# Patient Record
Sex: Female | Born: 1955 | Race: Black or African American | Hispanic: No
Health system: Southern US, Community
[De-identification: ages and names within clinical notes are randomized; demographics above are authoritative.]

---

## 1999-09-11 ENCOUNTER — Ambulatory Visit (HOSPITAL_COMMUNITY): Admission: RE | Admit: 1999-09-11 | Discharge: 1999-09-11 | Payer: Self-pay | Admitting: *Deleted

## 2001-03-07 ENCOUNTER — Inpatient Hospital Stay (HOSPITAL_COMMUNITY): Admission: EM | Admit: 2001-03-07 | Discharge: 2001-03-14 | Payer: Self-pay | Admitting: Emergency Medicine

## 2001-03-08 ENCOUNTER — Encounter: Payer: Self-pay | Admitting: Nephrology

## 2001-03-09 ENCOUNTER — Encounter: Payer: Self-pay | Admitting: Nephrology

## 2001-03-11 ENCOUNTER — Encounter: Payer: Self-pay | Admitting: Nephrology

## 2001-03-13 ENCOUNTER — Encounter: Payer: Self-pay | Admitting: Nephrology

## 2001-06-09 ENCOUNTER — Ambulatory Visit (HOSPITAL_COMMUNITY): Admission: RE | Admit: 2001-06-09 | Discharge: 2001-06-10 | Payer: Self-pay | Admitting: Ophthalmology

## 2002-10-23 ENCOUNTER — Encounter: Payer: Self-pay | Admitting: Vascular Surgery

## 2002-10-23 ENCOUNTER — Ambulatory Visit (HOSPITAL_COMMUNITY): Admission: RE | Admit: 2002-10-23 | Discharge: 2002-10-23 | Payer: Self-pay | Admitting: Vascular Surgery

## 2003-02-09 ENCOUNTER — Ambulatory Visit (HOSPITAL_COMMUNITY): Admission: RE | Admit: 2003-02-09 | Discharge: 2003-02-09 | Payer: Self-pay | Admitting: General Surgery

## 2003-04-05 ENCOUNTER — Ambulatory Visit (HOSPITAL_COMMUNITY): Admission: RE | Admit: 2003-04-05 | Discharge: 2003-04-05 | Payer: Self-pay | Admitting: Vascular Surgery

## 2003-05-13 ENCOUNTER — Encounter (INDEPENDENT_AMBULATORY_CARE_PROVIDER_SITE_OTHER): Payer: Self-pay | Admitting: Specialist

## 2003-05-13 ENCOUNTER — Ambulatory Visit (HOSPITAL_COMMUNITY): Admission: RE | Admit: 2003-05-13 | Discharge: 2003-05-15 | Payer: Self-pay | Admitting: General Surgery

## 2003-05-21 ENCOUNTER — Inpatient Hospital Stay (HOSPITAL_COMMUNITY): Admission: EM | Admit: 2003-05-21 | Discharge: 2003-05-24 | Payer: Self-pay | Admitting: Emergency Medicine

## 2005-08-10 ENCOUNTER — Emergency Department (HOSPITAL_COMMUNITY): Admission: EM | Admit: 2005-08-10 | Discharge: 2005-08-10 | Payer: Self-pay | Admitting: *Deleted

## 2005-11-26 ENCOUNTER — Inpatient Hospital Stay (HOSPITAL_COMMUNITY): Admission: AD | Admit: 2005-11-26 | Discharge: 2005-11-27 | Payer: Self-pay | Admitting: Nephrology

## 2005-12-18 ENCOUNTER — Ambulatory Visit (HOSPITAL_COMMUNITY): Admission: RE | Admit: 2005-12-18 | Discharge: 2005-12-18 | Payer: Self-pay | Admitting: General Surgery

## 2006-05-05 ENCOUNTER — Inpatient Hospital Stay (HOSPITAL_COMMUNITY): Admission: AD | Admit: 2006-05-05 | Discharge: 2006-05-07 | Payer: Self-pay | Admitting: Nephrology

## 2007-02-13 ENCOUNTER — Ambulatory Visit (HOSPITAL_COMMUNITY): Admission: RE | Admit: 2007-02-13 | Discharge: 2007-02-13 | Payer: Self-pay | Admitting: General Surgery

## 2007-04-03 ENCOUNTER — Encounter: Admission: RE | Admit: 2007-04-03 | Discharge: 2007-04-03 | Payer: Self-pay | Admitting: General Surgery

## 2007-04-03 ENCOUNTER — Encounter (INDEPENDENT_AMBULATORY_CARE_PROVIDER_SITE_OTHER): Payer: Self-pay | Admitting: Diagnostic Radiology

## 2007-04-03 ENCOUNTER — Other Ambulatory Visit: Admission: RE | Admit: 2007-04-03 | Discharge: 2007-04-03 | Payer: Self-pay | Admitting: Diagnostic Radiology

## 2007-09-04 ENCOUNTER — Inpatient Hospital Stay (HOSPITAL_COMMUNITY): Admission: RE | Admit: 2007-09-04 | Discharge: 2007-09-07 | Payer: Self-pay | Admitting: Surgery

## 2007-09-04 ENCOUNTER — Encounter (INDEPENDENT_AMBULATORY_CARE_PROVIDER_SITE_OTHER): Payer: Self-pay | Admitting: Surgery

## 2007-10-28 ENCOUNTER — Ambulatory Visit: Payer: Self-pay | Admitting: Vascular Surgery

## 2007-10-30 ENCOUNTER — Ambulatory Visit (HOSPITAL_COMMUNITY): Admission: RE | Admit: 2007-10-30 | Discharge: 2007-10-30 | Payer: Self-pay | Admitting: Vascular Surgery

## 2007-10-30 ENCOUNTER — Ambulatory Visit: Payer: Self-pay | Admitting: Vascular Surgery

## 2008-02-16 ENCOUNTER — Ambulatory Visit: Payer: Self-pay | Admitting: Internal Medicine

## 2008-02-16 ENCOUNTER — Inpatient Hospital Stay (HOSPITAL_COMMUNITY): Admission: AD | Admit: 2008-02-16 | Discharge: 2008-02-20 | Payer: Self-pay | Admitting: Cardiology

## 2008-02-18 ENCOUNTER — Encounter: Payer: Self-pay | Admitting: Cardiology

## 2008-05-13 ENCOUNTER — Ambulatory Visit (HOSPITAL_COMMUNITY): Admission: RE | Admit: 2008-05-13 | Discharge: 2008-05-13 | Payer: Self-pay | Admitting: Orthopedic Surgery

## 2008-05-20 ENCOUNTER — Ambulatory Visit: Payer: Self-pay | Admitting: Internal Medicine

## 2008-07-06 DIAGNOSIS — E119 Type 2 diabetes mellitus without complications: Secondary | ICD-10-CM

## 2008-07-06 DIAGNOSIS — N2581 Secondary hyperparathyroidism of renal origin: Secondary | ICD-10-CM | POA: Insufficient documentation

## 2008-07-06 DIAGNOSIS — E785 Hyperlipidemia, unspecified: Secondary | ICD-10-CM

## 2008-07-06 DIAGNOSIS — E663 Overweight: Secondary | ICD-10-CM | POA: Insufficient documentation

## 2008-07-06 DIAGNOSIS — I10 Essential (primary) hypertension: Secondary | ICD-10-CM | POA: Insufficient documentation

## 2008-07-06 DIAGNOSIS — M1990 Unspecified osteoarthritis, unspecified site: Secondary | ICD-10-CM | POA: Insufficient documentation

## 2008-07-06 DIAGNOSIS — I2589 Other forms of chronic ischemic heart disease: Secondary | ICD-10-CM | POA: Insufficient documentation

## 2008-07-06 DIAGNOSIS — E039 Hypothyroidism, unspecified: Secondary | ICD-10-CM | POA: Insufficient documentation

## 2008-07-06 DIAGNOSIS — D649 Anemia, unspecified: Secondary | ICD-10-CM

## 2008-07-06 DIAGNOSIS — N186 End stage renal disease: Secondary | ICD-10-CM

## 2008-07-06 DIAGNOSIS — I2510 Atherosclerotic heart disease of native coronary artery without angina pectoris: Secondary | ICD-10-CM | POA: Insufficient documentation

## 2008-07-07 ENCOUNTER — Ambulatory Visit: Payer: Self-pay | Admitting: Cardiology

## 2008-07-07 DIAGNOSIS — R0989 Other specified symptoms and signs involving the circulatory and respiratory systems: Secondary | ICD-10-CM | POA: Insufficient documentation

## 2008-07-07 DIAGNOSIS — R079 Chest pain, unspecified: Secondary | ICD-10-CM

## 2008-07-19 ENCOUNTER — Telehealth (INDEPENDENT_AMBULATORY_CARE_PROVIDER_SITE_OTHER): Payer: Self-pay | Admitting: *Deleted

## 2008-07-20 ENCOUNTER — Ambulatory Visit: Payer: Self-pay

## 2008-07-20 ENCOUNTER — Encounter: Payer: Self-pay | Admitting: Internal Medicine

## 2008-09-16 ENCOUNTER — Encounter: Admission: RE | Admit: 2008-09-16 | Discharge: 2008-09-16 | Payer: Self-pay | Admitting: Orthopedic Surgery

## 2008-09-21 ENCOUNTER — Ambulatory Visit (HOSPITAL_BASED_OUTPATIENT_CLINIC_OR_DEPARTMENT_OTHER): Admission: RE | Admit: 2008-09-21 | Discharge: 2008-09-21 | Payer: Self-pay | Admitting: Orthopedic Surgery

## 2008-10-01 ENCOUNTER — Encounter (INDEPENDENT_AMBULATORY_CARE_PROVIDER_SITE_OTHER): Payer: Self-pay | Admitting: *Deleted

## 2009-01-11 ENCOUNTER — Ambulatory Visit: Payer: Self-pay | Admitting: Cardiology

## 2009-01-11 ENCOUNTER — Encounter (INDEPENDENT_AMBULATORY_CARE_PROVIDER_SITE_OTHER): Payer: Self-pay | Admitting: Internal Medicine

## 2009-01-13 ENCOUNTER — Inpatient Hospital Stay (HOSPITAL_COMMUNITY): Admission: EM | Admit: 2009-01-13 | Discharge: 2009-01-16 | Payer: Self-pay | Admitting: Internal Medicine

## 2009-02-08 ENCOUNTER — Encounter: Payer: Self-pay | Admitting: Internal Medicine

## 2009-02-08 ENCOUNTER — Ambulatory Visit: Payer: Self-pay | Admitting: Cardiology

## 2009-04-29 ENCOUNTER — Ambulatory Visit: Payer: Self-pay | Admitting: Internal Medicine

## 2009-05-04 ENCOUNTER — Encounter: Payer: Self-pay | Admitting: Internal Medicine

## 2009-05-09 ENCOUNTER — Encounter: Payer: Self-pay | Admitting: Internal Medicine

## 2009-07-06 ENCOUNTER — Encounter: Payer: Self-pay | Admitting: Internal Medicine

## 2009-07-28 ENCOUNTER — Ambulatory Visit: Payer: Self-pay | Admitting: Vascular Surgery

## 2009-07-28 ENCOUNTER — Ambulatory Visit: Payer: Self-pay | Admitting: Internal Medicine

## 2009-08-04 ENCOUNTER — Telehealth: Payer: Self-pay | Admitting: Internal Medicine

## 2009-08-04 LAB — CONVERTED CEMR LAB
AST: 16 units/L (ref 0–37)
Cholesterol: 137 mg/dL (ref 0–200)
HDL: 34.1 mg/dL — ABNORMAL LOW (ref >39.00)
LDL Cholesterol: 71 mg/dL (ref 0–99)
Total CHOL/HDL Ratio: 4
Triglycerides: 161 mg/dL — ABNORMAL HIGH (ref 0.0–149.0)
VLDL: 32.2 mg/dL (ref 0.0–40.0)

## 2009-08-25 ENCOUNTER — Ambulatory Visit: Payer: Self-pay | Admitting: Vascular Surgery

## 2009-08-25 ENCOUNTER — Inpatient Hospital Stay (HOSPITAL_COMMUNITY): Admission: EM | Admit: 2009-08-25 | Discharge: 2009-08-27 | Payer: Self-pay | Admitting: Emergency Medicine

## 2009-08-26 ENCOUNTER — Encounter (INDEPENDENT_AMBULATORY_CARE_PROVIDER_SITE_OTHER): Payer: Self-pay | Admitting: *Deleted

## 2009-10-06 ENCOUNTER — Ambulatory Visit: Payer: Self-pay | Admitting: Vascular Surgery

## 2009-10-20 ENCOUNTER — Ambulatory Visit (HOSPITAL_COMMUNITY): Admission: RE | Admit: 2009-10-20 | Discharge: 2009-10-20 | Payer: Self-pay | Admitting: Vascular Surgery

## 2009-10-26 ENCOUNTER — Ambulatory Visit: Payer: Self-pay | Admitting: Vascular Surgery

## 2010-03-17 ENCOUNTER — Encounter: Payer: Self-pay | Admitting: Internal Medicine

## 2010-03-17 ENCOUNTER — Ambulatory Visit
Admission: RE | Admit: 2010-03-17 | Discharge: 2010-03-17 | Payer: Self-pay | Source: Home / Self Care | Attending: Internal Medicine | Admitting: Internal Medicine

## 2010-03-27 ENCOUNTER — Telehealth (INDEPENDENT_AMBULATORY_CARE_PROVIDER_SITE_OTHER): Payer: Self-pay | Admitting: Radiology

## 2010-03-28 ENCOUNTER — Encounter (HOSPITAL_COMMUNITY)
Admission: RE | Admit: 2010-03-28 | Discharge: 2010-04-11 | Payer: Self-pay | Source: Home / Self Care | Attending: Internal Medicine | Admitting: Internal Medicine

## 2010-03-28 ENCOUNTER — Encounter: Payer: Self-pay | Admitting: Internal Medicine

## 2010-03-28 ENCOUNTER — Ambulatory Visit: Admission: RE | Admit: 2010-03-28 | Discharge: 2010-03-28 | Payer: Self-pay | Source: Home / Self Care

## 2010-03-30 ENCOUNTER — Ambulatory Visit: Admission: RE | Admit: 2010-03-30 | Discharge: 2010-03-30 | Payer: Self-pay | Source: Home / Self Care

## 2010-04-11 NOTE — Progress Notes (Signed)
Summary: returning call  Phone Note Call from Patient Call back at Home Phone [phone]   Caller: Patient Reason for Call: Talk to Nurse Summary of Call: returning call Initial call taken by: Migdalia Dk,  Aug 04, 2009 11:42 AM  Follow-up for Phone Call        Called patient and advised her of lab results. Follow-up by: Suzan Garibaldi RN

## 2010-04-11 NOTE — Miscellaneous (Signed)
  Clinical Lists Changes  Medications: Removed medication of SIMVASTATIN 40 MG TABS (SIMVASTATIN) take one in the evening Added new medication of SIMVASTATIN 80 MG TABS (SIMVASTATIN) 1 pill at bedtime - Signed Rx of SIMVASTATIN 80 MG TABS (SIMVASTATIN) 1 pill at bedtime;  #30 x 6;  Signed;  Entered by: Layne Benton, RN, BSN;  Authorized by: Sherrill Raring, MD, [facility];  Method used: [facility] [phone]*, [address] [place], [place], [place]  [address], Ph: [phone] or [phone], Fax: [phone]    Prescriptions: SIMVASTATIN 80 MG TABS (SIMVASTATIN) 1 pill at bedtime  #30 x 6   Entered by:   Layne Benton, RN, BSN   Authorized by:   Sherrill Raring, MD, [facility]   Signed by:   Layne Benton, RN, BSN on 05/09/2009   Method used:   Electronically to        [facility]. [phone]* (retail)       [address]       [place], [place]  [address]       Ph: [phone] or [phone]       Fax: [phone]   RxID:   [id number]

## 2010-04-11 NOTE — Assessment & Plan Note (Signed)
Summary: PER CHECK OUT/SF   Visit Type:  Follow-up Primary Provider:  dr Pecola Leisure in [place]  CC:  occ chest pain and sob fatigue.  History of Present Illness: Patient is a 55 yearold with a history of CAD (CAth with 100% L Cx; fills via collaterals)02/2009.  Has had 2 stress tests in recent past that show no ischemia.  The patient was last in clinic in November.  She was seen by Flavia Shipper. Since seen, she notees occasional chest pain.  Pain occurs approximately 1 x per wk .  Mid chest.  Not associated with activity. Can take 2 ntg for it.Goes away.  No real change since december. Reports dialysis is going ok. On last visit, atenolol was d/c'd  BP hjas been better.  Current Medications (verified): 1)  Nitroglycerin 0.4 Mg Subl (Nitroglycerin) .... Place 1 Tablet Under Tongue As Directed 2)  Calcium Acetate 667 Mg Caps (Calcium Acetate (Phos Binder)) .... Take 3 Tablets Before Meals 3)  Metoprolol Tartrate 25 Mg Tabs (Metoprolol Tartrate) .... Take One Two Times A Day 4)  Simvastatin 40 Mg Tabs (Simvastatin) .... Take One in The Evening 5)  Omeprazole 20 Mg Cpdr (Omeprazole) .... Take One in The Evening 6)  Hydroxyzine Hcl 25 Mg Tabs (Hydroxyzine Hcl) .... Take One Two Times A Day As Needed 7)  Dialyvite  Tabs (B Complex-C-Folic Acid) .... Take One Daily 8)  Tramadol Hcl 50 Mg Tabs (Tramadol Hcl) .... Take As Needed 9)  Isosorbide Mononitrate Cr 30 Mg Xr24h-Tab (Isosorbide Mononitrate) .... Take One Daily 10)  Synthroid 200 Mcg Tabs (Levothyroxine Sodium) .... Take One Daily 11)  Synthroid 25 Mcg Tabs (Levothyroxine Sodium) .... Take One Daily 12)  Bayer Aspirin 325 Mg Tabs (Aspirin) .... Take One Daily 13)  Humulin 70/30 70-30 % Susp (Insulin Isophane & Regular) .... As Directed  Allergies (verified): No Known Drug Allergies  Past History:  Past Medical History: Last updated: 02/08/2009 CAD      a. Cath 12/09 - Occluded LCX with R-L collats.      b. Low risk Myoviews in 05/2008 &  01/2009 CARDIOMYOPATHY, ISCHEMIC (ICD-414.8)      a. EF 45% HYPERTENSION, UNSPECIFIED (ICD-401.9) HYPERLIPIDEMIA-MIXED (ICD-272.4) DIABETES MELLITUS (ICD-250.00) HYPOTHYROIDISM (ICD-244.9) DEGENERATIVE JOINT DISEASE (ICD-715.90) SECONDARY HYPERPARATHYROIDISM (ICD-588.81) OVERWEIGHT/OBESITY (ICD-278.02) ANEMIA (ICD-285.9) RENAL FAILURE, END STAGE (ICD-585.6)    Past Surgical History: Last updated: 07/06/2008 laparoscopic cholecystectomy vent.  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  thyroidectomy parathyroidectomy medial meniscectomy as well as tubal ligation Decompression right median nerve  Family History: Last updated: 07/06/2008 Family History of Coronary Artery Disease:  Family History of CVA or Stroke:  Family History of Diabetes:   Social History: Last updated: 07/06/2008 Disabled  Married  Tobacco Use - No.  Alcohol Use - no Regular Exercise - no  Vital Signs:  Patient profile:   55 year old female Height:      64 inches Weight:      240 pounds BMI:     41.34 Pulse rate:   85 / minute BP sitting:   111 / 58  (left arm) Cuff size:   large  Vitals Entered By: Burnett Kanaris, CNA (April 29, 2009 1:09 PM)  Physical Exam  General:  Well developed, well nourished, in no acute distress. Head:  normocephalic and atraumatic Neck:  JVP is normal.  No bruits.  No JVD> Lungs:  CTA.  No rales or wheezes. Heart:  RRR.  S1, S2.  No S3  No  murmurs. Abdomen:  Supple.  No masses.  No heptatomegaly. Extremities:  2+ pulses.  No edema. Neurologic:  A and O x 3.   Impression & Recommendations:  Problem # 1:  CAD, UNSPECIFIED SITE (ICD-414.00) Continue current regimen.  Take NTG as needed for pain.  Cath/nuclear as noted. Her updated medication list for this problem includes:    Nitroglycerin 0.4 Mg Subl (Nitroglycerin) .Marland Kitchen... Place 1 tablet under tongue as directed    Metoprolol Tartrate 25 Mg Tabs (Metoprolol tartrate) .Marland Kitchen...  Take one two times a day    Isosorbide Mononitrate Cr 30 Mg Xr24h-tab (Isosorbide mononitrate) .Marland Kitchen... Take one daily    Bayer Aspirin 325 Mg Tabs (Aspirin) .Marland Kitchen... Take one daily  Problem # 2:  HYPERTENSION, UNSPECIFIED (ICD-401.9) Good control  Bp is a Skaff higher since atenolol was d/c'd Her updated medication list for this problem includes:    Metoprolol Tartrate 25 Mg Tabs (Metoprolol tartrate) .Marland Kitchen... Take one two times a day    Bayer Aspirin 325 Mg Tabs (Aspirin) .Marland Kitchen... Take one daily  Problem # 3:  HYPERLIPIDEMIA-MIXED (ICD-272.4) Keep on statin. Her updated medication list for this problem includes:    Simvastatin 40 Mg Tabs (Simvastatin) .Marland Kitchen... Take one in the evening  Patient Instructions: 1)  We have given you a prescription to have labs drawn at dialysis on Mon.--bmet, ast, lipid 2)  Follow up in August

## 2010-04-11 NOTE — Letter (Signed)
Summary: Appointment - Reminder 2  [facility], Main Office  [address]   [place], [place] [address]   Phone: [phone]  Fax: [phone]     August 26, 2009 MRN: [id number]   [facility] [place] [address] [place], [place]  [address]   Dear [name],  Our records indicate that it is time to schedule a follow-up appointment with Dr. Tenny Craw in August. It is very important that we reach you to schedule this appointment. We look forward to participating in your health care needs. Please contact us at the number listed above at your earliest convenience to schedule your appointment.  If you are unable to make an appointment at this time, give [place] a call so we can update our records.     Sincerely,   Migdalia Dk [facility] Scheduling Team

## 2010-04-13 NOTE — Progress Notes (Signed)
Summary: nuc pre-procedure  Phone Note Outgoing Call   Call placed by: Domenic Polite, CNMT,  March 27, 2010 12:35 PM Call placed to: Patient Reason for Call: Confirm/change Appt Summary of Call: Reviewed information on Myoview Information Sheet (see scanned document for further details).  Spoke with patient's son.      Nuclear Med Background Indications for Stress Test: Evaluation for Ischemia   History: Echo, Heart Catheterization  History Comments: 11/10 Echo-EF=55-60% /MPS-Inflat scar EF=42% ; 12/09 Heart CATH total CFX with collaterols EF 45% TX MED ESRD  Symptoms: Chest Pain, SOB    Nuclear Pre-Procedure Cardiac Risk Factors: Carotid Disease, Hypertension, IDDM Type 2, Lipids, Obesity Height (in): 64  Nuclear Med Study Referring MD:  P.Ross

## 2010-04-13 NOTE — Assessment & Plan Note (Addendum)
Summary: Cardiology Nuclear Testing  Nuclear Med Background Indications for Stress Test: Evaluation for Ischemia   History: Echo, Heart Catheterization  History Comments: 11/10 Echo-EF=55-60% /MPS-Inflat scar EF=42% ; 12/09 Heart CATH total CFX with collaterols EF 45% TX MED ESRD  Symptoms: Chest Pain, Chest Pain with Exertion, Nausea, SOB, Vomiting  Symptoms Comments: last pm   Nuclear Pre-Procedure Cardiac Risk Factors: Carotid Disease, Hypertension, IDDM Type 2, Lipids, Obesity Caffeine/Decaff Intake: none  NPO After: 4:00 PM Lungs: clear IV 0.9% NS with Angio Cath: 22g     IV Site: R Forearm IV Started by: Cathlyn Parsons, RN Chest Size (in) 46     Cup Size D     Height (in): 64 Weight (lb): 233 BMI: 40.14 Tech Comments: Atenolol  and Metoprolol held x 15hrs.  BS 182 at 1215  and no insulin this am.  Nuclear Med Study 1 or 2 day study:  2 day     Stress Test Type:  Eugenie Birks Reading MD:  Dietrich Pates, MD     Referring MD:  P.Shraddha Lebron Resting Radionuclide:  Technetium 52m Tetrofosmin     Resting Radionuclide Dose:  33 mCi  Stress Radionuclide:  Technetium 20m Tetrofosmin     Stress Radionuclide Dose:  33 mCi   Stress Protocol   Lexiscan: 0.4 mg   Stress Test Technologist:  Frederick Peers, EMT-P     Nuclear Technologist:  Domenic Polite, CNMT  Rest Procedure  Myocardial perfusion imaging was performed at rest 45 minutes following the intravenous administration of Technetium 63m Tetrofosmin.  Stress Procedure  The patient received IV Lexiscan 0.4 mg over 15-seconds.  Technetium 57m Tetrofosmin injected at 30-seconds.  There were no significant changes with infusion.  Quantitative spect images were obtained after a 45 minute delay.  QPS Raw Data Images:  Soft tissue (breast, diaphragm, subcutaneous fat) surround heart. Stress Images:  Soft tissue scatter deceases count density.  Defect in the inferolateral wall (base, mid, distal), inferior wall (base) and apex.   Otherwise normal perfusion. Rest Images:  No significant change from the stress images. Subtraction (SDS):  No evidence of ischemia. Transient Ischemic Dilatation:  .96  (Normal <1.22)  Lung/Heart Ratio:  .32  (Normal <0.45)  Quantitative Gated Spect Images QGS EDV:  154 ml QGS ESV:  88 ml QGS EF:  43 % QGS cine images:  Lateral hypokinesis.   Overall Impression  Exercise Capacity: Lexiscan with no exercise. BP Response: Normal blood pressure response. Clinical Symptoms: No chest pain ECG Impression: No significant ST segment change suggestive of ischemia. Overall Impression Comments: Scar and possible soft tissue attenuation in the inferior and inferolateral walls.  NO evidence of ischemia.  Appended Document: Cardiology Nuclear Testing Myoview with no evidence of ischemia.  LVEF 43% which is unchanged from previousl  Appended Document: Cardiology Nuclear Testing [facility] for call back.  Appended Document: Cardiology Nuclear Testing Called patient with results.

## 2010-04-13 NOTE — Assessment & Plan Note (Signed)
Summary: rov/mj   Visit Type:  rov Primary Provider:  Dr. Pecola Leisure in [place]  CC:  chest pain....sob....denies any edema.  History of Present Illness: Patient is a 55 year old with a history of CAD (CAth with 100% L Cx; fills via collaterals)02/2009.  Has had 2 stress tests in  past that show no ischemia.  I saw her in Feb 2011. SInce I saw her she has noted pain in her chest and SOB with activity.  Episodes of pain are not always associated with activity.  Current Medications (verified): 1)  Nitroglycerin 0.4 Mg Subl (Nitroglycerin) .... Place 1 Tablet Under Tongue As Directed 2)  Calcium Acetate 667 Mg Caps (Calcium Acetate (Phos Binder)) .... Take 3 Tablets Before Meals 3)  Metoprolol Tartrate 25 Mg Tabs (Metoprolol Tartrate) .... Take One Two Times A Day 4)  Omeprazole 20 Mg Cpdr (Omeprazole) .... Take One in The Evening 5)  Hydroxyzine Hcl 25 Mg Tabs (Hydroxyzine Hcl) .... Take One Two Times A Day As Needed 6)  Dialyvite  Tabs (B Complex-C-Folic Acid) .... Take One Daily 7)  Tramadol Hcl 50 Mg Tabs (Tramadol Hcl) .... Take As Needed 8)  Isosorbide Mononitrate Cr 30 Mg Xr24h-Tab (Isosorbide Mononitrate) .... Take One Daily 9)  Synthroid 200 Mcg Tabs (Levothyroxine Sodium) .... Take One Daily 10)  Synthroid 25 Mcg Tabs (Levothyroxine Sodium) .... Take One Daily 11)  Bayer Aspirin 325 Mg Tabs (Aspirin) .... Take One Daily 12)  Humulin 70/30 70-30 % Susp (Insulin Isophane & Regular) .... As Directed 13)  Simvastatin 40 Mg Tabs (Simvastatin) .Marland Kitchen.. 1 Tab At Bedtime 14)  Atenolol 25 Mg Tabs (Atenolol) .Marland Kitchen.. 1 Tab At Bedtime  Allergies (verified): No Known Drug Allergies  Past History:  Past medical, surgical, family and social histories (including risk factors) reviewed, and no changes noted (except as noted below).  Past Medical History: Reviewed history from 02/08/2009 and no changes required. CAD      a. Cath 12/09 - Occluded LCX with R-L collats.      b. Low risk Myoviews in  05/2008 & 01/2009 CARDIOMYOPATHY, ISCHEMIC (ICD-414.8)      a. EF 45% HYPERTENSION, UNSPECIFIED (ICD-401.9) HYPERLIPIDEMIA-MIXED (ICD-272.4) DIABETES MELLITUS (ICD-250.00) HYPOTHYROIDISM (ICD-244.9) DEGENERATIVE JOINT DISEASE (ICD-715.90) SECONDARY HYPERPARATHYROIDISM (ICD-588.81) OVERWEIGHT/OBESITY (ICD-278.02) ANEMIA (ICD-285.9) RENAL FAILURE, END STAGE (ICD-585.6)    Past Surgical History: Reviewed history from 07/06/2008 and no changes required. laparoscopic cholecystectomy vent.  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  thyroidectomy parathyroidectomy medial meniscectomy as well as tubal ligation Decompression right median nerve  Family History: Reviewed history from 07/06/2008 and no changes required. Family History of Coronary Artery Disease:  Family History of CVA or Stroke:  Family History of Diabetes:   Social History: Reviewed history from 07/06/2008 and no changes required. Disabled  Married  Tobacco Use - No.  Alcohol Use - no Regular Exercise - no  Review of Systems       Systems reviewed.  NEg to the above problem except as noted above.  Vital Signs:  Patient profile:   55 year old female Height:      64 inches Weight:      232.25 pounds BMI:     40.01 Pulse rate:   94 / minute Pulse rhythm:   irregular BP sitting:   126 / 78  (left arm) Cuff size:   large  Vitals Entered By: Danielle Rankin, CMA (March 17, 2010 2:40 PM)  Physical Exam  Additional Exam:  patient is in  NAD HEENT:  Normocephalic, atraumatic. EOMI, PERRLA.  Neck: JVP is normal. No thyromegaly. No bruits.  Lungs: clear to auscultation. No rales no wheezes.  Heart: Regular rate and rhythm. Normal S1, S2. No S3.   No significant murmurs. PMI not displaced.  Abdomen:  Supple, nontender. Normal bowel sounds. No masses. No hepatomegaly.  Extremities:   Good distal pulses throughout. No lower extremity edema.  Musculoskeletal :moving all extremities.    Neuro:   alert and oriented x3.    Impression & Recommendations:  Problem # 1:  CHEST PAIN UNSPECIFIED (ICD-786.50) Patient with episodes of CP and SOB.  Pain is not completely typical.  Would recomm stress test to r/o ischemia.  Problem # 2:  HYPERTENSION, UNSPECIFIED (ICD-401.9) Adequate control  Problem # 3:  HYPERLIPIDEMIA-MIXED (ICD-272.4) Assessment: Improved Good.  LDL was 71, HDL was 34.  Continue. Her updated medication list for this problem includes:    Simvastatin 40 Mg Tabs (Simvastatin) .Marland Kitchen... 1 tab at bedtime  Other Orders: EKG w/ Interpretation (93000) Nuclear Stress Test (Nuc Stress Test)  Patient Instructions: 1)  Your physician has requested that you have a lexiscan myoview.  For further information please visit [email or web address]  Please follow instruction sheet, as given. 2)  Your physician wants you to follow-up in: 6 months  You will receive a reminder letter in the mail two months in advance. If you don't receive a letter, please call our office to schedule the follow-up appointment. Prescriptions: SIMVASTATIN 40 MG TABS (SIMVASTATIN) 1 tab at bedtime  #90 x 3   Entered by:   Danielle Rankin, CMA   Authorized by:   Sherrill Raring, MD, [facility]   Signed by:   Danielle Rankin, CMA on 03/17/2010   Method used:   [facility]ronically to        [facility]. [phone]* (retail)       [address]       [place], [place]  [address]       Ph: [phone] or [phone]       Fax: [phone]   RxID:   [id number]

## 2010-04-26 ENCOUNTER — Encounter: Payer: Self-pay | Admitting: Internal Medicine

## 2010-05-03 NOTE — Miscellaneous (Addendum)
Summary: WAITING ON RETURN PHONE CALL  Clinical Lists Changes I LEFT PT A MESSAGE REGARDING  ZOCOR 80MG   TAB . PT  IS TAKING 40MG  DAILY --- DOES PT WANT 40MG  TABS OR DOES PT WANT TO BREAK 80 MG TABS 1/2...Marland KitchenMarland KitchenMarland Kitchen   Appended Document: WAITING ON RETURN PHONE CALL No answer at work or home phone and unable to leave a message.

## 2010-05-25 ENCOUNTER — Telehealth: Payer: Self-pay | Admitting: Internal Medicine

## 2010-05-28 LAB — CBC
HCT: 26.1 % — ABNORMAL LOW (ref 36.0–46.0)
HCT: 26.3 % — ABNORMAL LOW (ref 36.0–46.0)
Hemoglobin: 8.6 g/dL — ABNORMAL LOW (ref 12.0–15.0)
Hemoglobin: 8.8 g/dL — ABNORMAL LOW (ref 12.0–15.0)
MCHC: 32.9 g/dL (ref 30.0–36.0)
MCHC: 33.3 g/dL (ref 30.0–36.0)
MCV: 92.1 fL (ref 78.0–100.0)
MCV: 92.4 fL (ref 78.0–100.0)
MCV: 94.3 fL (ref 78.0–100.0)
RBC: 2.13 MIL/uL — ABNORMAL LOW (ref 3.87–5.11)
RBC: 2.86 MIL/uL — ABNORMAL LOW (ref 3.87–5.11)
RDW: 15.4 % (ref 11.5–15.5)
WBC: 10.2 10*3/uL (ref 4.0–10.5)

## 2010-05-28 LAB — BASIC METABOLIC PANEL
CO2: 29 mEq/L (ref 19–32)
CO2: 31 mEq/L (ref 19–32)
Calcium: 8.1 mg/dL — ABNORMAL LOW (ref 8.4–10.5)
Chloride: 100 mEq/L (ref 96–112)
Chloride: 100 mEq/L (ref 96–112)
Chloride: 102 mEq/L (ref 96–112)
Creatinine, Ser: 8.49 mg/dL — ABNORMAL HIGH (ref 0.4–1.2)
GFR calc Af Amer: 6 mL/min — ABNORMAL LOW (ref >60)
GFR calc Af Amer: 6 mL/min — ABNORMAL LOW (ref >60)
Glucose, Bld: 220 mg/dL — ABNORMAL HIGH (ref 70–99)
Potassium: 3.7 mEq/L (ref 3.5–5.1)
Sodium: 138 mEq/L (ref 135–145)
Sodium: 139 mEq/L (ref 135–145)

## 2010-05-28 LAB — GLUCOSE, CAPILLARY
Glucose-Capillary: 204 mg/dL — ABNORMAL HIGH (ref 70–99)
Glucose-Capillary: 204 mg/dL — ABNORMAL HIGH (ref 70–99)
Glucose-Capillary: 227 mg/dL — ABNORMAL HIGH (ref 70–99)

## 2010-05-28 LAB — DIFFERENTIAL
Eosinophils Relative: 0 % (ref 0–5)
Lymphocytes Relative: 11 % — ABNORMAL LOW (ref 12–46)
Lymphs Abs: 1.4 10*3/uL (ref 0.7–4.0)
Monocytes Absolute: 0.5 10*3/uL (ref 0.1–1.0)
Monocytes Relative: 4 % (ref 3–12)

## 2010-05-28 LAB — TYPE AND SCREEN: ABO/RH(D): B POS

## 2010-05-28 LAB — PREPARE RBC (CROSSMATCH)

## 2010-05-30 NOTE — Progress Notes (Signed)
Summary: refill request  Phone Note Refill Request Message from:  Patient on May 25, 2010 11:02 AM  [phone] [facility]/zocor   Method Requested: Telephone to Pharmacy  Follow-up for Phone Call        called into [facility] Follow-up by: Hardin Negus, RMA,  May 25, 2010 3:00 PM    Prescriptions: SIMVASTATIN 40 MG TABS (SIMVASTATIN) 1 tab at bedtime  #90 x 3   Entered by:   Hardin Negus, RMA   Authorized by:   Sherrill Raring, MD, [facility]   Signed by:   Hardin Negus, RMA on 05/25/2010   Method used:   Telephoned to ...       [facility]  [address] [phone]* (retail)       [address]       [place], [place]  [address]       Ph: [phone] or [phone]       Fax: [phone]   RxID:   [id number]

## 2010-06-01 ENCOUNTER — Telehealth: Payer: Self-pay | Admitting: Internal Medicine

## 2010-06-01 NOTE — Telephone Encounter (Signed)
Left message

## 2010-06-14 LAB — DIFFERENTIAL
Basophils Absolute: 0 10*3/uL (ref 0.0–0.1)
Basophils Absolute: 0 10*3/uL (ref 0.0–0.1)
Basophils Relative: 1 % (ref 0–1)
Basophils Relative: 1 % (ref 0–1)
Eosinophils Absolute: 0.3 10*3/uL (ref 0.0–0.7)
Eosinophils Absolute: 0.4 10*3/uL (ref 0.0–0.7)
Eosinophils Relative: 4 % (ref 0–5)
Eosinophils Relative: 4 % (ref 0–5)
Eosinophils Relative: 4 % (ref 0–5)
Eosinophils Relative: 5 % (ref 0–5)
Lymphocytes Relative: 19 % (ref 12–46)
Lymphocytes Relative: 21 % (ref 12–46)
Lymphocytes Relative: 26 % (ref 12–46)
Lymphocytes Relative: 28 % (ref 12–46)
Lymphs Abs: 1.3 10*3/uL (ref 0.7–4.0)
Lymphs Abs: 1.4 10*3/uL (ref 0.7–4.0)
Monocytes Absolute: 0.6 10*3/uL (ref 0.1–1.0)
Monocytes Absolute: 0.7 10*3/uL (ref 0.1–1.0)
Monocytes Absolute: 0.8 10*3/uL (ref 0.1–1.0)
Monocytes Absolute: 0.8 10*3/uL (ref 0.1–1.0)
Monocytes Relative: 10 % (ref 3–12)
Monocytes Relative: 11 % (ref 3–12)
Monocytes Relative: 12 % (ref 3–12)
Neutrophils Relative %: 47 % (ref 43–77)

## 2010-06-14 LAB — TSH: TSH: 10.074 u[IU]/mL — ABNORMAL HIGH (ref 0.350–4.500)

## 2010-06-14 LAB — BASIC METABOLIC PANEL
BUN: 27 mg/dL — ABNORMAL HIGH (ref 6–23)
CO2: 26 mEq/L (ref 19–32)
CO2: 28 mEq/L (ref 19–32)
CO2: 30 mEq/L (ref 19–32)
Calcium: 8.1 mg/dL — ABNORMAL LOW (ref 8.4–10.5)
Calcium: 8.1 mg/dL — ABNORMAL LOW (ref 8.4–10.5)
Chloride: 95 mEq/L — ABNORMAL LOW (ref 96–112)
Chloride: 96 mEq/L (ref 96–112)
Chloride: 99 mEq/L (ref 96–112)
Creatinine, Ser: 7.67 mg/dL — ABNORMAL HIGH (ref 0.4–1.2)
Creatinine, Ser: 9.46 mg/dL — ABNORMAL HIGH (ref 0.4–1.2)
GFR calc Af Amer: 5 mL/min — ABNORMAL LOW (ref >60)
GFR calc Af Amer: 5 mL/min — ABNORMAL LOW (ref >60)
GFR calc non Af Amer: 4 mL/min — ABNORMAL LOW (ref >60)
GFR calc non Af Amer: 6 mL/min — ABNORMAL LOW (ref >60)
Glucose, Bld: 127 mg/dL — ABNORMAL HIGH (ref 70–99)
Glucose, Bld: 170 mg/dL — ABNORMAL HIGH (ref 70–99)
Glucose, Bld: 191 mg/dL — ABNORMAL HIGH (ref 70–99)
Potassium: 4.8 mEq/L (ref 3.5–5.1)
Sodium: 132 mEq/L — ABNORMAL LOW (ref 135–145)

## 2010-06-14 LAB — CBC
HCT: 32.3 % — ABNORMAL LOW (ref 36.0–46.0)
HCT: 32.5 % — ABNORMAL LOW (ref 36.0–46.0)
HCT: 32.5 % — ABNORMAL LOW (ref 36.0–46.0)
HCT: 33.5 % — ABNORMAL LOW (ref 36.0–46.0)
HCT: 34.4 % — ABNORMAL LOW (ref 36.0–46.0)
HCT: 34.4 % — ABNORMAL LOW (ref 36.0–46.0)
Hemoglobin: 10.8 g/dL — ABNORMAL LOW (ref 12.0–15.0)
Hemoglobin: 11 g/dL — ABNORMAL LOW (ref 12.0–15.0)
Hemoglobin: 11.2 g/dL — ABNORMAL LOW (ref 12.0–15.0)
Hemoglobin: 11.3 g/dL — ABNORMAL LOW (ref 12.0–15.0)
Hemoglobin: 11.5 g/dL — ABNORMAL LOW (ref 12.0–15.0)
Hemoglobin: 11.6 g/dL — ABNORMAL LOW (ref 12.0–15.0)
MCHC: 32.8 g/dL (ref 30.0–36.0)
MCHC: 33.3 g/dL (ref 30.0–36.0)
MCHC: 33.5 g/dL (ref 30.0–36.0)
MCHC: 33.8 g/dL (ref 30.0–36.0)
MCV: 90.9 fL (ref 78.0–100.0)
MCV: 91.4 fL (ref 78.0–100.0)
MCV: 92.1 fL (ref 78.0–100.0)
MCV: 94.5 fL (ref 78.0–100.0)
Platelets: 163 10*3/uL (ref 150–400)
Platelets: 172 10*3/uL (ref 150–400)
Platelets: 175 10*3/uL (ref 150–400)
Platelets: 179 10*3/uL (ref 150–400)
Platelets: 219 10*3/uL (ref 150–400)
RBC: 3.64 MIL/uL — ABNORMAL LOW (ref 3.87–5.11)
RBC: 3.73 MIL/uL — ABNORMAL LOW (ref 3.87–5.11)
RDW: 17.5 % — ABNORMAL HIGH (ref 11.5–15.5)
RDW: 17.5 % — ABNORMAL HIGH (ref 11.5–15.5)
RDW: 17.6 % — ABNORMAL HIGH (ref 11.5–15.5)
RDW: 17.8 % — ABNORMAL HIGH (ref 11.5–15.5)
WBC: 6.1 10*3/uL (ref 4.0–10.5)
WBC: 7.5 10*3/uL (ref 4.0–10.5)
WBC: 7.9 10*3/uL (ref 4.0–10.5)
WBC: 7.9 10*3/uL (ref 4.0–10.5)

## 2010-06-14 LAB — BLOOD GAS, ARTERIAL
Acid-Base Excess: 2 mmol/L (ref 0.0–2.0)
Bicarbonate: 26.9 mEq/L — ABNORMAL HIGH (ref 20.0–24.0)
O2 Content: 2 L/min
O2 Saturation: 98.9 %
Patient temperature: 98.6
TCO2: 28.4 mmol/L (ref 0–100)
pCO2 arterial: 49.1 mmHg — ABNORMAL HIGH (ref 35.0–45.0)
pH, Arterial: 7.358 (ref 7.350–7.400)
pO2, Arterial: 114 mmHg — ABNORMAL HIGH (ref 80.0–100.0)

## 2010-06-14 LAB — RENAL FUNCTION PANEL
Albumin: 2.9 g/dL — ABNORMAL LOW (ref 3.5–5.2)
Albumin: 3.1 g/dL — ABNORMAL LOW (ref 3.5–5.2)
BUN: 35 mg/dL — ABNORMAL HIGH (ref 6–23)
BUN: 58 mg/dL — ABNORMAL HIGH (ref 6–23)
CO2: 27 mEq/L (ref 19–32)
CO2: 29 mEq/L (ref 19–32)
Calcium: 7.3 mg/dL — ABNORMAL LOW (ref 8.4–10.5)
Calcium: 8.3 mg/dL — ABNORMAL LOW (ref 8.4–10.5)
Chloride: 99 mEq/L (ref 96–112)
Chloride: 99 mEq/L (ref 96–112)
Creatinine, Ser: 10.57 mg/dL — ABNORMAL HIGH (ref 0.4–1.2)
Creatinine, Ser: 5.62 mg/dL — ABNORMAL HIGH (ref 0.4–1.2)
Creatinine, Ser: 7.72 mg/dL — ABNORMAL HIGH (ref 0.4–1.2)
GFR calc Af Amer: 10 mL/min — ABNORMAL LOW (ref >60)
GFR calc Af Amer: 5 mL/min — ABNORMAL LOW (ref >60)
GFR calc non Af Amer: 4 mL/min — ABNORMAL LOW (ref >60)
GFR calc non Af Amer: 8 mL/min — ABNORMAL LOW (ref >60)
Glucose, Bld: 134 mg/dL — ABNORMAL HIGH (ref 70–99)
Glucose, Bld: 203 mg/dL — ABNORMAL HIGH (ref 70–99)
Phosphorus: 2.8 mg/dL (ref 2.3–4.6)
Phosphorus: 3.1 mg/dL (ref 2.3–4.6)
Potassium: 3.6 mEq/L (ref 3.5–5.1)
Potassium: 4.4 mEq/L (ref 3.5–5.1)
Sodium: 137 mEq/L (ref 135–145)

## 2010-06-14 LAB — CK TOTAL AND CKMB (NOT AT ARMC)
CK, MB: 0.5 ng/mL (ref 0.3–4.0)
Relative Index: INVALID (ref 0.0–2.5)
Total CK: 47 U/L (ref 7–177)
Total CK: 60 U/L (ref 7–177)

## 2010-06-14 LAB — GLUCOSE, CAPILLARY
Glucose-Capillary: 113 mg/dL — ABNORMAL HIGH (ref 70–99)
Glucose-Capillary: 123 mg/dL — ABNORMAL HIGH (ref 70–99)
Glucose-Capillary: 136 mg/dL — ABNORMAL HIGH (ref 70–99)
Glucose-Capillary: 168 mg/dL — ABNORMAL HIGH (ref 70–99)
Glucose-Capillary: 169 mg/dL — ABNORMAL HIGH (ref 70–99)
Glucose-Capillary: 175 mg/dL — ABNORMAL HIGH (ref 70–99)
Glucose-Capillary: 182 mg/dL — ABNORMAL HIGH (ref 70–99)
Glucose-Capillary: 182 mg/dL — ABNORMAL HIGH (ref 70–99)
Glucose-Capillary: 190 mg/dL — ABNORMAL HIGH (ref 70–99)
Glucose-Capillary: 211 mg/dL — ABNORMAL HIGH (ref 70–99)
Glucose-Capillary: 212 mg/dL — ABNORMAL HIGH (ref 70–99)
Glucose-Capillary: 212 mg/dL — ABNORMAL HIGH (ref 70–99)
Glucose-Capillary: 257 mg/dL — ABNORMAL HIGH (ref 70–99)

## 2010-06-14 LAB — HEPARIN LEVEL (UNFRACTIONATED)
Heparin Unfractionated: 0.13 IU/mL — ABNORMAL LOW (ref 0.30–0.70)
Heparin Unfractionated: 0.18 IU/mL — ABNORMAL LOW (ref 0.30–0.70)
Heparin Unfractionated: 0.21 IU/mL — ABNORMAL LOW (ref 0.30–0.70)
Heparin Unfractionated: 0.37 IU/mL (ref 0.30–0.70)
Heparin Unfractionated: 0.5 IU/mL (ref 0.30–0.70)
Heparin Unfractionated: 0.92 IU/mL — ABNORMAL HIGH (ref 0.30–0.70)

## 2010-06-14 LAB — LIPID PANEL
HDL: 28 mg/dL — ABNORMAL LOW (ref >39)
Total CHOL/HDL Ratio: 3.6 RATIO
Triglycerides: 246 mg/dL — ABNORMAL HIGH (ref <150)
VLDL: 49 mg/dL — ABNORMAL HIGH (ref 0–40)

## 2010-06-14 LAB — TROPONIN I
Troponin I: 0.02 ng/mL (ref 0.00–0.06)
Troponin I: 0.02 ng/mL (ref 0.00–0.06)
Troponin I: 0.03 ng/mL (ref 0.00–0.06)

## 2010-06-18 LAB — POCT I-STAT, CHEM 8
Chloride: 101 mEq/L (ref 96–112)
Creatinine, Ser: 9.2 mg/dL — ABNORMAL HIGH (ref 0.4–1.2)
Glucose, Bld: 208 mg/dL — ABNORMAL HIGH (ref 70–99)
Hemoglobin: 14.6 g/dL (ref 12.0–15.0)
Potassium: 4.9 mEq/L (ref 3.5–5.1)
Sodium: 139 mEq/L (ref 135–145)

## 2010-06-22 LAB — POCT I-STAT 4, (NA,K, GLUC, HGB,HCT)
Glucose, Bld: 76 mg/dL (ref 70–99)
HCT: 47 % — ABNORMAL HIGH (ref 36.0–46.0)
Hemoglobin: 16 g/dL — ABNORMAL HIGH (ref 12.0–15.0)
Potassium: 3.6 mEq/L (ref 3.5–5.1)

## 2010-06-22 LAB — GLUCOSE, CAPILLARY
Glucose-Capillary: 66 mg/dL — ABNORMAL LOW (ref 70–99)
Glucose-Capillary: 67 mg/dL — ABNORMAL LOW (ref 70–99)

## 2010-06-27 LAB — CBC
HCT: 41.2 % (ref 36.0–46.0)
MCHC: 33.7 g/dL (ref 30.0–36.0)
MCV: 98.2 fL (ref 78.0–100.0)
Platelets: 146 10*3/uL — ABNORMAL LOW (ref 150–400)
RDW: 20.1 % — ABNORMAL HIGH (ref 11.5–15.5)

## 2010-06-27 LAB — BASIC METABOLIC PANEL
BUN: 35 mg/dL — ABNORMAL HIGH (ref 6–23)
CO2: 27 mEq/L (ref 19–32)
Chloride: 94 mEq/L — ABNORMAL LOW (ref 96–112)
Glucose, Bld: 426 mg/dL — ABNORMAL HIGH (ref 70–99)
Potassium: 3.7 mEq/L (ref 3.5–5.1)

## 2010-07-25 NOTE — Procedures (Signed)
DUPLEX DEEP VENOUS EXAM - LOWER EXTREMITY   INDICATION:  Swelling   HISTORY:  Edema:  yes  Trauma/Surgery:  no  Pain:  yes  PE:  no  Previous DVT:  no  Anticoagulants:  no  Other:   DUPLEX EXAM:                CFV   SFV   PopV  PTV    GSV                R  L  R  L  R  L  R   L  R  L  Thrombosis    0  0     0     0      0     0  Spontaneous   +  +     +     +      +     +  Phasic        +  +     +     +      +     +  Augmentation  +  +     +     +      +     +  Compressible  +  +     +     +      +     +  Competent     +  +     +     +      +     +   Legend:  + - yes  o - no  p - partial  D - decreased   IMPRESSION:  Left leg appears to be negative for  deep venous  thrombosis.    _____________________________  Di Kindle. Edilia Bo, M.D.   NT/MEDQ  D:  07/28/2009  T:  07/28/2009  Job:  [id number]

## 2010-07-25 NOTE — Op Note (Signed)
[name], [name]                ACCOUNT NO.:  [id number]   MEDICAL RECORD NO.:  [id number]          PATIENT TYPE:  INP   LOCATION:  6735                         FACILITY:  [facility]   PHYSICIAN:  Velora Heckler, MD      DATE OF BIRTH:  1955-09-06   DATE OF PROCEDURE:  DATE OF DISCHARGE:                               OPERATIVE REPORT   PREOPERATIVE DIAGNOSES:  1. Right thyroid nodule with mild compressive symptoms.  2. Secondary hyperparathyroidism.  3. End-stage renal disease.   POSTOPERATIVE DIAGNOSES:  1. Right thyroid nodule with mild compressive symptoms.  2. Secondary hyperparathyroidism.  3. End-stage renal disease.   PROCEDURE:  1. Total thyroidectomy.  2. Total parathyroidectomy.  3. Autotransplantation of parathyroid tissue to left forearm.   SURGEON:  Velora Heckler, MD, FACS   ASSISTANT:  Leonie Man, M.D.   ANESTHESIA:  General per Dr. Sheldon Silvan.   ESTIMATED BLOOD LOSS:  Minimal.   PREPARATION:  Betadine.   COMPLICATIONS:  None.   INDICATIONS:  The patient is a 55 year old black female followed in my  practice by Dr. Claud Kelp.  She is referred by Dr. Marina Gravel for  thyroid nodules and secondary hyperparathyroidism.  The patient now  comes to surgery for total thyroidectomy and total parathyroidectomy  with autotransplantation.   OPERATIVE PROCEDURE:  Procedure was done in OR #16 at the [facility].  The patient was brought to the operating room,  placed in supine position on the operating room table.  Following  administration of general anesthesia, the patient is positioned and then  prepped and draped in the usual strict aseptic fashion.  After  ascertaining that an adequate level of anesthesia been achieved, a  Kocher incision was made with a #15 blade.  Dissection was carried  through subcutaneous tissues and platysma.  Hemostasis was obtained with  electrocautery.  Skin flaps were elevated at cephalad and caudad  from  the thyroid notch to the sternal notch.  A Mahorner self-retaining  retractor is placed for exposure.  Strap muscles were incised in the  midline.  Dissection was begun on the left side.  Left thyroid lobe was  exposed.  It contains subtle nodularity, but no dominant nor discrete  masses.   Next, the right thyroid lobe was exposed.  There is a dominant nodule  occupying a large portion of the right thyroid lobe.  Gland was  dissected out using a Pension scheme manager.  Venous tributaries were  divided between medium Ligaclips with the harmonic scalpel.  Superior  pole vessels are dissected out, ligated in continuity with 2-0 silk  ties, and divided with a harmonic scalpel.  The gland is gently  mobilized using the Pension scheme manager.  Inferior venous tributaries are  divided between medium Ligaclips with harmonic scalpel.  Gland is  mobilized anteriorly.  Branches of the inferior thyroid artery are  divided between small Ligaclips.  Recurrent nerve was identified and  preserved.  Dissection was carried down the ligament of berry, which is  transected with electrocautery.  Gland is mobilized up and onto  the  anterior trachea.  Small pyramidal lobe was included with the isthmus.  Isthmus was mobilized across the midline.   Next, we turned our attention back to the left thyroid lobe.  Again, the  lobe was gently mobilized.  Superior pole vessels were dissected out,  ligated in continuity with 2-0 silk ties, and divided with the harmonic  scalpel.  Inferior venous tributaries are divided between medium  Ligaclips with harmonic scalpel.  Branches of the inferior thyroid  artery are divided between small and medium Ligaclips with the harmonic  scalpel.  Recurrent nerve was identified and preserved.  Ligament of  berry was transected with electrocautery and the remainder of the  isthmus is excised off the anterior trachea.  The entire thyroid gland  was then submitted to pathology for  review.  A suture was used to mark  the right superior pole of the thyroid gland for orientation purposes.   Next, we turned to parathyroid exploration.  The left neck was explored  first.  In the inferior location fair mount of adipose tissue, a  slightly enlarged parathyroid gland was identified.  This was dissected  out and vascular pedicle was divided between small Ligaclips.  A biopsy  was taken and submitted to pathology, which confirmed parathyroid  tissue.  Remainder of the gland was placed in iced saline on the back  table, labeled left inferior parathyroid gland.   Continued on the left side, dissection in the tracheoesophageal groove  reveals an enlarged superior parathyroid gland.  This was gently  mobilized away from the underlying nerve and esophagus.  It was markedly  enlarged measuring greater than 2 cm in length.  It was dissected out.  Vascular pedicles divided between medium Ligaclips and the gland is  excised.  Biopsy is submitted to pathology and confirms parathyroid  tissue.  The remainder of the tissue is placed in saline on ice on the  back table, labeled left superior parathyroid gland.  Dry pack was  placed in the left neck.   We turned our attention to the right.  Dissection in the  tracheoesophageal groove reveals an enlarged parathyroid gland, which  was gently mobilized away from the esophagus and recurrent nerve.  It is  dissected down to a vascular pedicle, which is divided between medium  Ligaclips.  This gland also measures over 2 cm in length.  Biopsy  confirms parathyroid tissue.  Specimen was placed in iced saline on the  back table, labeled as right superior parathyroid gland.   Continuing with dissection in the inferior position, no further  parathyroid tissue is identified.  Several small lymph nodes are  identified.  Dissection was carried inferiorly.  There is a relatively  ectatic innominate artery crossing in the low neck.  Dissection was   carried down to the bifurcation at the origin of the carotid.  Arteries  were mobilized across their anterior surface allowing for entry into the  anterior mediastinum.  Just above the level of the innominate vein, a  nodular density was identified.  This was gently dissected out  mobilized.  This appears to be an enlarged parathyroid gland arising in  the superior pole of the thymus.  Gland was gently mobilized and  vascular pedicle divided between Ligaclips.  Superior pole of the thymus  was divided with the harmonic scalpel.  Gland was completely excised.  It was sectioned on the operating room table and a small fragment is  submitted as frozen section biopsy confirming parathyroid  tissue.  The  entire gland was placed in iced saline on the back table, labeled as  right inferior parathyroid gland.   Neck is irrigated with warm saline.  Good hemostasis was noted.  Surgicel was placed in the operative field.  Strap muscles were  reapproximated in the midline with interrupted 3-0 Vicryl sutures.  Platysma was closed with interrupted 3-0 Vicryl sutures.  Skin was  closed with a running 4-0 Monocryl subcuticular suture.  Wound is washed  and dried and Benzoin Steri-Strips were applied.  Sterile dressings were  applied.   Next, we turned our attention to the left forearm.  The arm was placed  on arm board and then prepped and draped in usual strict aseptic  fashion.  After ascertaining that an adequate level of anesthesia had  been maintained, a 4 cm incision was made over the brachioradialis  muscle.  Dissection was carried through subcutaneous tissues and  hemostasis obtained with the electrocautery.  Skin flaps were elevated  and a Weitlaner retractor was placed for exposure.  The left inferior  parathyroid gland was selected.  It was sectioned into 8, 1 mm  fragments.  These were then implanted into the brachioradialis muscle by  making a small incision with a #15 blade, creating a  submuscular pocket  with a hemostat, inserting a fragment of parathyroid tissue, and closing  the overlying fascia with interrupted 4-0 Prolene sutures.  This  exercise is repeated 8 times.  Subcutaneous tissues were then closed  with interrupted 3-0 Vicryl sutures.  Skin was closed with running 4-0  Monocryl subcuticular suture.  Benzoin and Steri-Strips were applied.  Dressings were applied.  The patient is awakened from anesthesia and  brought to the recovery room in stable condition.  The patient tolerated  the procedure well.      Velora Heckler, MD  Electronically Signed     TMG/MEDQ  D:  09/04/2007  T:  09/05/2007  Job:  [id number]   cc:   Wilber Bihari. Caryn Section, M.D.  Angelia Mould. Derrell Lolling, M.D.  [facility]

## 2010-07-25 NOTE — Assessment & Plan Note (Signed)
OFFICE VISIT   [name], [name]  DOB:  29-Feb-1956                                       10/06/2009  [id number]#:[id number]   I saw the patient in the office today for followup of her right arm  graft.  This is a 55 year old woman who had a right upper arm AV graft  for many years.  She had a revision in August of 2009 and the upper half  of the graft was replaced because of graft degeneration.  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  Pressure was held for venostasis.  The bleeding was controlled but the  graft clotted.  I took her to the operating room on 08/26/2009 and did a  thrombectomy revision of her graft.  I was able to salvage her graft and  bypass around the aneurysmal segment.  She comes in today because she  states that she has been having some problems with bleeding after  dialysis from her graft.   On examination the incisions have healed nicely.  Her blood pressure is  112/65, heart rate is 84.  The graft has an excellent thrill and is not  especially pulsatile.  The hand is warm and well-perfused with no  significant steal.   Given that she is having problems with bleeding and also has noted some  arm swelling intermittently in the right arm I think that we need to  rule out a central venous stenosis.  I did shoot an intraoperative  fistulogram but was not able to evaluate the central veins in the  operating room.   We will set her up for a fistulogram with central venogram.  If she had  a subclavian stenosis amenable to angioplasty this could be potentially  addressed at that time.  If no problems are identified and she continues  to have problems of bleeding from the graft we might have to simply  consider placement of a new graft.  I will see her back after her  fistulogram.     Di Kindle. Edilia Bo, M.D.  Electronically  Signed   CSD/MEDQ  D:  10/06/2009  T:  10/07/2009  Job:  [id number]   cc:   [employer]

## 2010-07-25 NOTE — Consult Note (Signed)
NEW PATIENT CONSULTATION   [place]enzie, [place] M  DOB:  [birth year]                                       07/28/2009  [id number]#:[id number]   I saw the patient in the office today in consultation concerning pain  and swelling in both legs.  She was referred by Dr. Hyman Hopes.  This is a  pleasant 55 year old woman who noted the gradual onset of pain and  swelling in both legs approximately a month ago.  She describes burning  pain which begins in the groin and extends all the way down to her feet  on both sides.  Symptoms are more significant on the left side.  She has  also noted some swelling in the left leg over the last month.  She  states her symptoms are aggravated by walking and there are no  alleviating factors.  She does not have some associated restless legs at  night.  She denies any history of rest pain.  Of note, she has had  previous toe amputations on both feet that healed well.  These were done  at outlying institutions.  She has had no previous history of DVT or  phlebitis that she is aware of.  She was sent for vascular consultation.   PAST MEDICAL HISTORY:  Significant for type 2 diabetes.  She is insulin  dependent.  In addition she has obesity, hypertension,  hypercholesterolemia.  She states she had a light myocardial  infarction 2 years ago.  She denies any history of congestive heart  failure, COPD.  She does have chronic kidney disease and is on dialysis  Mondays, Wednesdays and Fridays.   FAMILY HISTORY:  There is no history of premature cardiovascular disease  that she is aware of.   SOCIAL HISTORY:  She is married.  She has two children.  She does not  use tobacco.   REVIEW OF SYSTEMS:  GENERAL:  She has had no recent weight loss, weight  gain or problems with her appetite.  CARDIOVASCULAR:  She has had no chest pain, chest pressure, palpitations  or arrhythmias.  She does admit to dyspnea on exertion.  She has had no  significant orthopnea.  PULMONARY:  She has had no productive cough, bronchitis, asthma or  wheezing.  GI:  She occasionally has problems swallowing.  GU:  She has had no dysuria.  She makes minimal urine.  NEUROLOGIC:  She has had no dizziness, blackouts, headaches or seizures.  MUSCULOSKELETAL:  She does have joint pain.  PSYCHIATRIC, ENT, HEMATOLOGIC review of systems is unremarkable as  documented on the medical history form in her chart.   PHYSICAL EXAMINATION:  This is a pleasant 55 year old woman who appears  her stated age.  Blood pressure is 155/84, heart rate is 88, temperature  is 98.  HEENT:  Unremarkable.  Lungs are clear bilaterally to  auscultation without rales, rhonchi or wheezing.  Cardiovascular exam I  do not detect any carotid bruits.  She has a regular rate and rhythm.  She has palpable femoral pulses and palpable pedal pulses bilaterally.  She has mild left lower extremity swelling.  Abdomen:  Soft and  nontender with normal pitched bowel sounds.  No masses appreciated.  Difficult to assess her abdomen.  However, because of her size.  On  musculoskeletal exam in the extremities she has a functioning right  upper arm AV graft.  She has had a previous right third toe amputation  which is well-healed and a previous left great toe amputation which is  well-healed.  Neurologic:  Exam is no focal weakness or paresthesias.  Skin:  There are no ulcers or rashes.  There are some small  telangiectasias.   I have independently interpreted her arterial Doppler study which shows  normal arterial signals in both feet with biphasic signals in the  posterior tibial and dorsalis pedis positions bilaterally.  However, her  vessels are quite calcified making ABIs difficult to obtain, although  the toe pressure on the right was 118 mmHg which suggests essentially  normal flow.   Given the leg swelling we did obtain a venous duplex scan which I  independently interpreted and this shows no evidence of DVT  in the left  lower extremity.   I have reassured her that she has no evidence of significant arterial  insufficiency which would explain her symptoms.  She does have a history  of neuropathy and I think most of her pain is related to her neuropathy.  With respect to her leg swelling there is no evidence of DVT.  She may  have some mild lymphedema.  We have discussed the importance of  intermittent leg elevation.  I will see her back as needed.     Di Kindle. Edilia Bo, M.D.  Electronically Signed   CSD/MEDQ  D:  07/28/2009  T:  07/29/2009  Job:  [id number]   cc:   Dr. Elvis Coil

## 2010-07-25 NOTE — Assessment & Plan Note (Signed)
OFFICE VISIT   [name], [name]  DOB:  11-15-1955                                       10/26/2009  [id number]#:[id number]   I saw patient in the office today for follow-up after her recent  venogram and venous angioplasty.  I had seen her on 10/06/2009 with a  consultation concerning bleeding from her right arm graft and right arm  swelling.  She had a bleeding episode from an eschar over her graft and  on 08/26/2009, I had performed a thrombectomy of her graft and bypassed  around an aneurysmal segment.  Intraoperative arteriogram at that time  did not show any significant problems within the graft; however, I could  not further evaluate the central veins.  I set her up for a fistulogram.  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 Deanne Coffer, and she has done well  from that standpoint.  She states that she has had no further problems  with bleeding from her graft.  She also feels that her arm swelling in  her right upper arm is better.  She has had no significant pain in the  right arm.   On review of systems, she has had no fever or chills.   On physical examination, this is a pleasant 55 year old woman who  appears her stated age.  Blood pressure is 107/66, heart rate is 80.  Respiratory rate is 20.  Her upper arm graft has an excellent thrill.  It is not pulsatile.  Her incisions have healed nicely.  Lungs are clear  bilaterally to auscultation.   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   It appears that the angioplasty has helped with the bleeding problems  she was having, and her arm swelling is also definitely improved.  Hopefully we will continue to get good function out of this graft.  We  will see her back p.r.n.     Di Kindle. Edilia Bo, M.D.  Electronically Signed   CSD/MEDQ  D:   10/26/2009  T:  10/26/2009  Job:  [id number]   cc:   [facility]

## 2010-07-25 NOTE — H&P (Signed)
[name], [name]                ACCOUNT NO.:  [id number]   MEDICAL RECORD NO.:  [id number]          PATIENT TYPE:  INP   LOCATION:  2920                         FACILITY:  [facility]   PHYSICIAN:  Pricilla Riffle, MD, FACCDATE OF BIRTH:  [birth year]   DATE OF ADMISSION:  02/16/2008  DATE OF DISCHARGE:                              HISTORY & PHYSICAL   IDENTIFICATION:  The patient is a 55 year old who was transferred from  [facility] for evaluation of chest pain.   HISTORY OF PRESENT ILLNESS:  The patient has no known history of  coronary artery disease.  About 2 weeks, she began experiencing left-  sided chest pain.  It starts in the parasternal region radiating it  [name].  Radiating also to her left arm.  Comes in with nausea  and vomiting and some diaphoresis.  Usually lasts about 30 minutes.  She  has taken pain medicines which have helped (hydrocodone).  Note the  patient was seen in [place] by Dr. Sherril Croon and was planning to undergo a  stress Myoview.   Today, she was in dialysis and noted pain.  She was transferred to the  [facility] ED and from there transferred to [facility].  Currently, she complains of mild pain 3/10 in intensity.   She notes episodes at sleeping over the past week that have woken her.  Notes plus/minus reflux.  No real change in her GI history.  No fevers,  chills, no productive cough.   ALLERGIES:  None.   MEDICATIONS ON ADMISSION:  1. Hydroxyzine 25 q.6 p.r.n.  2. PhosLo tabs 3 tablets t.i.d.  3. Synthroid 0.25 mg.  4. Phenergan 25 one-half to one q.4-6.  5. Amlodipine 10.  6. Atenolol 25.  7. Simvastatin 40 at bedtime.  8. Insulin 70/30 25 units b.i.d.  9. Dialyvite daily.   PAST MEDICAL HISTORY:  1. Diabetes times over 30 years.  2. Hypertension.  3. End-stage renal disease on dialysis three times per week for the      past 10 years.  4. History of secondary hyperparathyroidism.  5. History of syncope in the  past (March 2005).  6. Obesity.  7. Status post laparoscopic cholecystectomy .  8. Status post vent.  9. History of ventral hernia.  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  11.DJD.  12.Depression.   SOCIAL HISTORY:  The patient lives in [place].  She is married, on  disability, stays in her house most of the time except for dialysis,  does not drink, does not smoke.   FAMILY HISTORY:  Mother diabetic, CVA.  Father died of an MI.   REVIEW OF SYSTEMS:  All systems reviewed negative to the above problem  except as noted above.   PHYSICAL EXAMINATION:  GENERAL:  The patient currently in no acute  distress.  VITAL SIGNS:  Blood pressure 118/63, pulse is 72, temperature is 98,  respiratory rate 13, O2 sat on 2 L 100%, the patient currently with some  minimal chest  discomfort.  HEENT: Normocephalic, atraumatic, EOMI, PERRL.  Mouth is dry.  NECK:  No bruits.  Difficult again to assess JVP give size.  LUNGS:  Relatively clear.  No rales or wheezes.  CARDIAC:  Regular rate and rhythm.  S1-S2.  No S3 or S4, grade 1-2/6  systolic murmur heard best at the base.  CHEST:  Tender to palpation though this brings on a different type of  pain.  ABDOMEN:  Tender in the left periumbilical region.  No rebound, no  masses.  Normal bowel sounds.  EXTREMITIES:  1+ dorsalis pedis status post again amputation left great  toe and third toe on the right.  Feet are warm.   Chest x-ray cardiomegaly without evidence of CHF, 12-lead EKG shows  normal sinus rhythm at 75 beats per minute.  Possible anteroseptal MI.   LABORATORY DATA:  Significant for a hemoglobin of 15, WBC of 6.3, BUN  and creatinine of 33 and 7.15, potassium of 4.2.  Initial CK is 67,  troponin 0.02.   IMPRESSION:  1. The patient is a 55 year old woman no known coronary artery disease      but multiple risk factors (diabetes, hypertension, end-stage renal       disease, dyslipidemia) now with chest pain on and off x2 weeks and      atypical and that it is not associated with any activity but she is      not that active, notes also increased shortness of breath over the      past several months again very different from 1 year ago.  On exam,      she is obese.  Chest is tender but again different type of pain,      otherwise, exam is negative.  EKG is negative.  Labs initially are      negative.  Would recommend cardiac catheterization to define      anatomy.  Would plan on right and left heart catheterization given      shortness of breath to evaluate pulmonary pressures.  Risks and      benefits explained.  The patient understands and agrees to proceed.  2. End-stage renal disease on dialysis will need to schedule to      continue.  She did not get today.  Will notify Renal in the a.m.  3. Hypertension, stable.  4. Dyslipidemia, continue on statin.  5. Diabetes on home medications follow glucoses.  6. Hypothyroidism, check TSH.  7. Anemia, hemoglobin actually good.      Pricilla Riffle, MD, [facility]  Electronically Signed     PVR/MEDQ  D:  02/16/2008  T:  02/17/2008  Job:  [id number]

## 2010-07-25 NOTE — Assessment & Plan Note (Signed)
OFFICE VISIT   JAZLYNE, GAUGER  DOB:  [birth year]                                       10/28/2007  ZOXWR#:[id number]   The patient was seen in the office today to evaluate her right upper arm  graft which had a recent bleeding episode.  She was seen at [facility]  emergency department where a suture was placed in the right upper arm  graft.  This controlled the bleeding and the patient has had no further  episodes of bleeding.  The graft is functioning well.  She has had left  upper arm and forearm grafts in the past but this right arm graft has  functioned for the past several years.   On exam she has an excellent brachial and radial pulse with no evidence  of any steal distally.  There is a nylon suture placed in the proximal  portion of the graft at about the 10 o'clock position.  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  The graft  does have an excellent pulse and palpable thrill.   I think the best plan would be to replace the right upper arm graft to  prevent any further bleeding and to place a Diatek catheter.  We have  scheduled that for this Thursday, August 20 by Dr. Darl Householder [facility] as an outpatient.   Quita Skye Hart [place], M.D.  Electronically Signed   JDL/MEDQ  D:  10/28/2007  T:  10/29/2007  Job:  [id number]

## 2010-07-25 NOTE — Cardiovascular Report (Signed)
NAMESHIFA, [name]                ACCOUNT NO.:  [id number]   MEDICAL RECORD NO.:  [id number]           PATIENT TYPE:   LOCATION:                                 FACILITY:   PHYSICIAN:  Rollene Rotunda, MD, FACCDATE OF BIRTH:  06-25-1955   DATE OF PROCEDURE:  02/17/2008  DATE OF DISCHARGE:                            CARDIAC CATHETERIZATION   PRIMARY CARE Remijio Holleran:  Nadine Counts.   REASON FOR PRESENTATION:  The patient with chest pain.   PROCEDURE NOTE:  Left heart catheterization performed via the right  femoral artery.  The vessel was cannulated using the anterior wall  puncture.  A #6-French arterial sheath was inserted via the modified  Seldinger technique.  Preformed Judkins and a pigtail catheter were  utilized.  The patient tolerated the procedure well and left the lab in  stable condition.   RESULTS:  Hemodynamics:  LV 96/13, AO 99/54.  Coronaries, left main was  normal.  The LAD was large, wrapping the apex.  There was proximal 25%  stenosis.  First diagonal was small and normal.  Circumflex in the AV  groove had proximal occlusion before the large obtuse marginal (this was  chronic in total with left-to-left and right-to-left collaterals), there  was a large obtuse marginal (it was a 100% proximal with 99% mid  stenosis), right coronary artery was dominant.  It was normal through  its course.  The PDA emerged from the acute marginal and was widely  patent and normal.  A left ventriculogram was obtained in the RAO  projection.  The EF is 45% with mild global hypokinesis.   CONCLUSION:  Chronic total single-vessel disease and a large obtuse  marginal.  Mild-to-moderate left ventricular dysfunction, otherwise.   PLAN:  We will check an echo.  We will most likely pursue medical  management.      Rollene Rotunda, MD, [facility]  Electronically Signed     JH/MEDQ  D:  04/22/2008  T:  04/22/2008  Job:  [id number]

## 2010-07-25 NOTE — Op Note (Signed)
[name], [name]                ACCOUNT NO.:  [id number]   MEDICAL RECORD NO.:  [id number]          PATIENT TYPE:  AMB   LOCATION:  SDS                          FACILITY:  [facility]   PHYSICIAN:  Larina Earthly, M.D.    DATE OF BIRTH:  [birth year]   DATE OF PROCEDURE:  DATE OF DISCHARGE:  10/30/2007                               OPERATIVE REPORT   PREOPERATIVE DIAGNOSIS:  End-stage renal disease with degeneration of  upper segment of right upper arm arteriovenous Gore-Tex graft.   POSTOPERATIVE DIAGNOSIS:  End-stage renal disease with degeneration of  upper segment of right upper arm arteriovenous Gore-Tex graft.   PROCEDURE:  Revision of right upper arm arteriovenous Gore-Tex graft  through replacement of upper segment of graft.   SURGEON:  Larina Earthly, MD   ASSISTANT:  Nurse.   ANESTHESIA:  MAC.   COMPLICATIONS:  None.   DISPOSITION:  Recovery room, stable.   INDICATIONS FOR PROCEDURE:  The patient was brought to the operating  room for revision of her graft.  She had had a recent bleed from an  upper segment of degenerative portion of her upper arm graft.  This  graft has been present for many years and had not required any revision  since January 2005.  Physical exam, she did have some degeneration of  the graft in the lower portion, but there was no evidence of skin  breakdown.  She did have one punctate area that had bled, but was  controlled with a suture and outlined emergency department in the upper  arm segment.  The graft had an excellent trill.  Decision was made to  revise the upper arm by replacement of this portion of the graft to  salvage the lower portion of the graft for access.   PROCEDURE IN DETAIL:  The patient was prepped and draped in usual  sterile fashion.  Incision was made near the axilla over the existing  graft and taken down to control the graft, and incision was made in the  upper arm portion of graft just below where the recent bleed has  been.  Then, the graft was circled at this level as well.  The graft was  occluded proximally and distally in the segment of graft  under the  disrupted area was removed in this entirety.  A new tunnel was created  lateral to the old graft.  A 6-mm graft was brought to the tunnel.  The  close segments of the existing graft were flushed with heparinized  saline and reoccluded.  The interposition graft was sewn end-to-end to  the old graft near the axillary anastomosis and in the midportion of the  graft.  This was with the running 6-0 Prolene suture.  Clamps removed  and excellent thrill was noted.  The wounds were irrigated with saline  and the hemostasis  was achieved with cautery.  Wounds were closed with 3-0 Vicryl in the  subcutaneous and subcuticular tissue.  The area of the bleeding had been  debrided and was closed with an interrupted 3-0 nylon stitch.  Sterile  dressing was applied and the patient was taken to the recovery room in  stable condition.      Larina Earthly, M.D.  Electronically Signed     TFE/MEDQ  D:  10/30/2007  T:  10/31/2007  Job:  [id number]

## 2010-07-25 NOTE — Op Note (Signed)
[name], [name]                ACCOUNT NO.:  [id number]   MEDICAL RECORD NO.:  [id number]          PATIENT TYPE:  AMB   LOCATION:  DSC                          FACILITY:  [facility]   PHYSICIAN:  Cindee Salt, M.D.       DATE OF BIRTH:  14-Oct-1955   DATE OF PROCEDURE:  09/21/2008  DATE OF DISCHARGE:                               OPERATIVE REPORT   PREOPERATIVE DIAGNOSIS:  Carpal tunnel syndrome, left hand.   POSTOPERATIVE DIAGNOSIS:  Carpal tunnel syndrome, left hand.   OPERATION:  Decompression of left median nerve.   SURGEON:  Cindee Salt, MD   ANESTHESIA:  General.   HISTORY:  The patient is a 55 year old female with a history of  diabetes.  She has developed carpal tunnel syndrome, EMG nerve  conductions positive, and this has not responded to conservative  treatment.  She has elected to undergo surgical decompression.  Pre,  peri, and postoperative course have been discussed along with the risks  and complications.  She is aware that there is no guarantee with the  surgery; possibility of infection; recurrence; injury to arteries,  nerves, tendons; incomplete relief of symptoms; and dystrophy.  In the  preoperative area, the patient is seen, the extremity marked by both the  patient and surgeon, antibiotic given.   PROCEDURE:  The patient was brought to the operating room where a  general anesthetic was carried out without difficulty.  She was prepped  using ChloraPrep in supine position with the left arm free.  A 3-minute  dry time was allowed.  A time-out taken confirming the patient and  procedure.  She was then draped.  The limb was exsanguinated with an  Esmarch bandage.  Tourniquet placed on the forearm was inflated to 230  mmHg.  An incision was made.  Significant bleeding occurred indicative  of the tourniquet being adequately inflated.  This was re-wrapped and  elevated to 250 mm.  Again, this did not fully stop bleeding and this  was increased to 175.  The wound was  then deepened with blunt  dissection.  Bleeders were electrocauterized with bipolar.  The  dissection carried down splitting palmar fascia.  Superficial palmar  arch was identified.  The flexor tendon to the ring Browder finger  identified to the ulnar side of median nerve and carpal retinaculum was  incised with sharp dissection.  Right angle and Sewall retractor were  placed between skin and forearm fascia.  The fascia was then released  for approximately 1.5 cm proximal to the wrist crease under direct  vision.  The canal was explored.  Area of compression to the nerve was  apparent.  No further lesions were identified.  The wound was irrigated  with saline and closed with interrupted 4-0 Vicryl Rapide sutures.  Local infiltration with 0.25% Marcaine without epinephrine was then  given.  A sterile  compressive dressing and splint to the wrist was applied.  The patient  tolerated the procedure well and was taken to the recovery room for  observation in satisfactory condition.  She will be discharged home, to  return to the [facility] [place] in 1 week, on Vicodin.           ______________________________  Cindee Salt, M.D.     GK/MEDQ  D:  09/21/2008  T:  09/21/2008  Job:  [id number]   cc:   Wilber Bihari. Caryn Section, M.D.

## 2010-07-25 NOTE — Assessment & Plan Note (Signed)
[facility]                            CARDIOLOGY OFFICE NOTE   NAME:Stepanek, MYKEL MOHL                       MRN:          [id number]  DATE:05/20/2008                            DOB:          1956-02-18    Ms. Heward is a 55 year old woman.  She was last seen by our service  back in December when she was admitted on transfer for evaluation of  chest pain.  Refer to the discharge summary for full details.  The  patient did have a cardiac catheterization done 12/08.  The LAD had 25%  stenosis.  Circumflex had a proximal occlusion before a large OM.  There  were left-to-left and right-to-left collaterals.  There was large OM  that was occluded.  RCA was dominant, normal throughout.  LVEF was 45%  with mild global hypokinesis.  Plan was for medical therapy.   The patient missed the next 2 clinic appointments because of snow.  She  has had intermittent chest pains, not associated with any particular  activity.  She notes no increase and this occurs about 2 times per week.  Nitroglycerin does help and she needs refills.  Otherwise, dialysis is  going okay.  Her current medicines include PhosLo tablets t.i.d.,  Synthroid 0.25, amlodipine 10, atenolol 25, simvastatin 40, insulin  70/30, and renal multivitamin .   PHYSICAL EXAMINATION:  GENERAL:  The patient is in no distress.  VITAL SIGNS:  Blood pressure 114/66, pulse 76 and regular, weight 226.  NECK:  JVP is normal.  LUNGS:  Clear.  No rales.  CARDIAC:  Regular rate and rhythm.  S1 and S2.  No significant murmurs.  ABDOMEN:  Benign.  EXTREMITIES:  No edema.   A 12-lead EKG, normal sinus rhythm, 73 beats per minute.  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  Left anterior  hemiblock.   IMPRESSION:  1. Coronary artery disease.  I would continue on medical therapy.  Her      symptoms are unchanged from discharge from hospital, they are not      associated with dialysis or with  activity, would follow.  I do not      want to go up on her medicines because I think this would make      dialysis difficult.  2. Dyslipidemia.  Would continue on simvastatin.  We will need      followup.  She can get her labs done at the [facility] and be      faxed here.  3. Hypothyroidism.  Continue on Synthroid.  4. Diabetes, non-insulin.   I will set to see the patient back later this spring, sooner if problems  develop.     Pricilla Riffle, MD, [facility]  Electronically Signed   PVR/MedQ  DD: 05/20/2008  DT: 05/21/2008  Job #: [id number]

## 2010-07-25 NOTE — Discharge Summary (Signed)
[name], [name]                ACCOUNT NO.:  [id number]   MEDICAL RECORD NO.:  [id number]          PATIENT TYPE:  INP   LOCATION:  2033                         FACILITY:  [facility]   PHYSICIAN:  Pricilla Riffle, MD, FACCDATE OF BIRTH:  [birth year]   DATE OF ADMISSION:  02/16/2008  DATE OF DISCHARGE:  02/20/2008                               DISCHARGE SUMMARY   PROCEDURES:  1. Hemodialysis.  2. Cardiac catheterization.  3. Coronary arteriogram.  4. Left ventriculogram.  5. A 2-D echocardiogram.   PRIMARY FINAL DISCHARGE DIAGNOSIS:  Coronary artery disease, medical  therapy recommended.   SECONDARY DIAGNOSES:  1. Ischemic cardiomyopathy with an ejection fraction of 45% on this      admission.  2. Hyperlipidemia with a total cholesterol of 81, triglycerides 60,      HDL 28, LDL 41 on this admission.  3. End-stage renal disease on hemodialysis.  4. Hypertension.  5. Anemia.  6. 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  7. Status post right arterio-venous graft for hemodialysis with      spontaneous rupture in August 2009, resection of the pseudoaneurysm      and new proximal segment Gore-Tex inserted by Dr. Arbie Cookey.  8. History of total thyroidectomy for a thyroid nodule as well as      parathyroidectomy.  9. History of Charcot joint.  10.History of anemia.  11.Status post medial meniscectomy as well as tubal ligation.  12.Obesity.  13.Secondary hyperparathyroidism.  14.Amputation of the left great toe in 2002.  15.Degenerative joint disease.  16.History of ventral hernia.  17.Hidradenitis in 2004.  18.Status post cholecystectomy.   TIME AT DISCHARGE:  Forty eight minutes.   HOSPITAL COURSE:  [name] is a 55 year old female with no previous  history of coronary artery disease.  She had chest pain and went to  [facility].  She had some nausea, vomiting, and diaphoresis.  She  was seen by Dr. Sherril Croon and planning  to undergo a stress Myoview but had  chest pain during dialysis and went to [facility] Room there.  Because hemodialysis is not available to inpatients at [facility], she was  transferred to [facility] for further evaluation and treatment.   Her cardiac enzymes were negative for MI.  Cardiac catheterization was  performed on February 17, 2008.  It showed a normal left main and an LAD  with a proximal 25% stenosis.  The circumflex was totalled in the AV  groove before a long OM.  This was felt to be chronically totalled with  left-to-left and right-to-left collaterals.  The large OM was totalled  proximally with the mid 90% stenosis.  The RCA was dominant and normal.  The PDA from the acute marginal was widely patent.  Her EF was 45% with  mild global hypokinesis.  Dr. Antoine Poche evaluated the films and felt that  she had significant single-vessel coronary artery disease but only mild  to moderate left ventricular dysfunction.  He felt that medical therapy  was the best option for her.  She was started on  Imdur, and cardiac  rehab was asked to see her.   The echocardiogram showed an EF of 55% with normal wall thickness and  mild mitral valvular regurgitation.  There was no pericardial effusion.  She was seen by cardiac rehab and ambulated with them.  Of note, she  continued to have chest pain which she described as a pressure and a  2/10.  This did not increase with exertion.  She was started on  nitrates, and her Imdur was increased from 15-30 mg a day.  There was a  pleuritic nature to her chest pain and so she was also given Tylenol.  She is to continue medical therapy for the chest pain and follow up as  an outpatient.   She was also seen by the renal team and dialyzed while she was here.  Her blood pressure was well controlled.   On February 20, 2008, [name] was evaluated by Dr. Tenny Craw.  Dr. Tenny Craw  felt that with medical therapy for her chest pain, she was stable for   discharge with close outpatient followup.   DISCHARGE INSTRUCTIONS:  1. Her activity level is to be increased gradually.  2. She is not to do any lifting for a week.  3. She is to call our office for any problems with the cath site.  4. She is to stick to a low-sodium diabetic diet.  5. She is to follow up with Dr. Tenny Craw on March 15, 2008, at 10:15 and      with Dr. Nadine Counts and with Dr. Caryn Section as needed.   DISCHARGE MEDICATIONS:  1. Hydroxyzine 25 mg q.i.d. p.r.n.  2. PhosLo 667 mg 3 tablets t.i.d.  3. Synthroid 0.25 mg daily.  4. Phenergan 25 mg one-half to one tablet q.i.d. p.r.n.  5. A 70/30 insulin 25 units b.i.d.  6. Dialyvite daily.  7. Aspirin 325 mg daily.  8. Metoprolol 25 mg b.i.d.  9. Simvastatin 40 mg daily.  10.Imdur 30 mg a day.  11.Nitroglycerin sublingual p.r.n.   She is not to take amlodipine or atenolol.      Theodore Demark, PA-C      Pricilla Riffle, MD, [facility]  Electronically Signed    RB/MEDQ  D:  02/20/2008  T:  02/21/2008  Job:  [id number]   cc:   Barbaraann Rondo. Caryn Section, M.D.

## 2010-07-25 NOTE — Op Note (Signed)
[name], [name]                ACCOUNT NO.:  [id number]   MEDICAL RECORD NO.:  [id number]          PATIENT TYPE:  AMB   LOCATION:  SDS                          FACILITY:  [facility]   PHYSICIAN:  Cindee Salt, M.D.       DATE OF BIRTH:  [birth year]   DATE OF PROCEDURE:  05/13/2008  DATE OF DISCHARGE:  05/13/2008                               OPERATIVE REPORT   PREOPERATIVE DIAGNOSIS:  Carpal tunnel syndrome right hand.   POSTOPERATIVE DIAGNOSIS:  Carpal tunnel syndrome right hand.   OPERATION:  Decompression right median nerve.   SURGEON:  Cindee Salt, MD   ANESTHESIA:  General.   ANESTHESIOLOGIST:  Kaylyn Layer. Michelle Piper, MD   HISTORY:  The patient is a 55 year old female with a history of  diabetes, high blood pressure, dialysis with a carpal tunnel syndrome,  EMG nerve conduction is positive and responsive to conservative  treatment.  She has elected to undergo surgical decompression.  Pre,  peri and postoperative course have been discussed along with risk of  complications.  She is aware that there is no guarantee with the  surgery, possible infection, recurrence injury to arteries, nerves,  tendons, incomplete relief of symptoms and dystrophy.  In the  preoperative area, the patient is seen.  The extremity marked by both  the patient and the surgeon.  Antibiotic given.   PROCEDURE:  The patient was brought to the operating room where a  general anesthetic was carried out under the direction of Dr. Michelle Piper.  She was prepped using DuraPrep, supine position, right arm free.  Time-  out was taken.  A longitudinal incision was made in the palm and carried  down through the subcutaneous tissue.  Bleeders were electrocauterized.  Palmar fascia was split, superficial palmar arch identified, the flexor  tendon to the ring and Brede finger identified.  To the ulnar side of  the median nerve, the carpal retinaculum was incised with sharp  dissection, right angle and Sewall retractor were placed  between the  skin and forearm fascia.  The fascia was released for approximately a  centimeter and a half proximal to the wrist crease under direct vision.  The canal was explored. The area of compression of the nerve was  apparent.  No further lesions were identified.  The wound was irrigated  and closed with interrupted 5-0 Vicryl Rapide sutures.  The area was  infiltrated with 0.25% Marcaine without epinephrine and 3 mL was used.  Sterile compressive dressing and splint to the wrist was applied.  The  patient tolerated the procedure well and was taken to the recovery room  for observation in satisfactory condition.  On deflation of the  tourniquet, all fingers immediately pinked.  She was discharged home to  return to the [facility] [place] in 1 week on Vicodin.           ______________________________  Cindee Salt, M.D.    GK/MEDQ  D:  05/13/2008  T:  05/14/2008  Job:  [id number]

## 2010-07-28 NOTE — H&P (Signed)
[facility]  Patient:    [name], [name] Visit Number: [id number] MRN: [id number]          Service Type: MED Location: 1800 [id number] [id number] Attending Physician:  Doug Sou Dictated by:   Dyke Maes, M.D. Admit Date:  03/07/2001                           History and Physical  CHIEF COMPLAINT: Acute onset of swollen and painful left arm.  HISTORY OF PRESENT ILLNESS: This is a 55 year old black female, with end-stage renal disease secondary to diabetes mellitus, who complains of acute onset of left arm swelling and pain earlier today.  Per her history, she has had a history of left central vein occlusion and is status post angioplasty at [facility] on three separate occasions.  The last one was approximately four weeks ago.  Apparently at [facility] radiology was called and they were unable to accommodate repeat venogram today, and thus she was sent to [facility] Emergency Room.  PAST MEDICAL HISTORY:  1. Diabetes.  2. Hypertension.  3. End-stage renal disease.  4. Apparent recurrent left central vein occlusion.  ALLERGIES: None.  MEDICATIONS:  1. Humulin 70/30 25 units in the morning, 28 units in the evening.  2. Elavil 25 mg q.h.s.  3. Os-Cal three with meals.  SOCIAL HISTORY:  Nonsmoker, nondrinker.  She [place] and [place]  FAMILY HISTORY: Negative for renal disease.  Positive for hypertension and diabetes in mother.  REVIEW OF SYSTEMS: No recent change in vision.  No shortness of breath.  No chest pain.  No recent change in bowel habits.  Pain in left arm, as noted above.  The rest of the Review Of Systems is unremarkable.  PHYSICAL EXAMINATION:  VITAL SIGNS: Blood pressure 157/78, pulse 106, respirations 18, temperature 97 degrees.  GENERAL: Healthy-appearing 55 year old black female, in mild acute distress secondary to pain and swelling of  her left upper extremity.  NECK: Mild swelling of left side of neck and shoulder.  LUNGS: Clear to auscultation.  HEART: Regular rate and rhythm without murmurs, rubs, or gallops.  ABDOMEN: Positive bowel sounds.  Nontender, nondistended.  No hepatosplenomegaly.  EXTREMITIES: Left arm swollen from the hand all the way up through the shoulder.  There is a graft in the left upper arm with a bruit and thrill.  No edema of lower extremities.  Right first toe has been amputated, with the site well-healed.  There was, however, an old suture remaining in that wound which was removed.  NEUROLOGIC: No focal deficits.  IMPRESSION:  1. Recurred left central vein occlusion.  2. Diabetes.  3. History of anemia.  PLAN: Will try and obtain bilateral central venogram in the morning.  I think she needs a new graft in her right upper arm if there is no occlusion in the right central veins.  I think it would be preferable that if angioplasty could occur on the left central vein one more time to have this done in order to buy time for a new graft to mature in order to avoid any more catheters. Dictated by:   Dyke Maes, M.D. Attending Physician:  Doug Sou DD:  03/07/01 TD:  03/08/01 Job: [id number]

## 2010-07-28 NOTE — H&P (Signed)
NAMESHEILIA, REZNICK                ACCOUNT NO.:  [id number]   MEDICAL RECORD NO.:  [id number]          PATIENT TYPE:  INP   LOCATION:  6705                         FACILITY:  [facility]   PHYSICIAN:  Mindi Slicker. Lowell Guitar, M.D.  DATE OF BIRTH:  1955-03-31   DATE OF ADMISSION:  11/26/2005  DATE OF DISCHARGE:                                HISTORY & PHYSICAL   CHIEF COMPLAINT:  Accu-Chek of 15 and found unresponsive by husband this  morning.   HISTORY OF PRESENT ILLNESS:  [name] is a 55 year old African American  female with end-stage renal disease secondary to diabetes and hypertension  who dialysis Monday, Wednesday, Friday in the [facility].  She  was in her usual state of health until yesterday when she went to bed early  at 4 p.m.  saying she was tired, possibly from church.  She ate two sausages  and two slices of bread before going to bed at 4 p.m.  Said her sugar at  that time was approximately 174.  She woke up around 11 and took her evening  dose of 70/30 insulin which she normally takes at 9 p.m. and drank some  regular soda and ate some crackers and did not check her blood sugar at this  time.  She awoke again around 3 or 4 and said she was feeling okay and got  up and ate some more crackers and regular soda.  Her husband found her  approximately 4 a.m. snoring louder than usual.  He tried to get some  sugar water into her but she was not responsive and EMS was called.  Sugar  was found to be 15.  This was corrected and she was transferred to [facility] for further evaluation and treatment.  Chest x-ray there showed  congestive heart failure.  At [facility] she had hypothermia and  prolonged altered mental status with limited responsiveness.  She was  transferred to [facility] for further evaluation and  treatment.   PAST MEDICAL HISTORY:  1. Secondary hyperparathyroidism.  She has been on high dose Zemplar,      which was  recently held due to calcium of 11.2.  She is on Renagel and      Sensipar.  2. High ferritin level most recent 1559 in July.  3. History of missing dialysis treatments and signing off early.  4. Hypertension.  5. Ventral hernia.  6. 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 Derrell Lolling.  7. Depression.  8. Multiple foot problems followed by Dr. Samuel Bouche in [place], [place].   SOCIAL HISTORY:  Lives at home with her husband.  She has two children, one  son and a daughter.  She denies alcohol, drugs and does not smoke.   FAMILY HISTORY:  Mother has diabetes.  Father died from unknown cause.  She  has nine brothers and four sisters.   REVIEW OF SYSTEMS:  GENERAL:  No fevers, no sweats.  Appetite is  good.  Patient had fatigue yesterday.  HEENT:  No nose bleeds.  No visual changes.  No new skin rashes or lesions.  Mild shortness of breath, no chest pain.  No  paroxysmal nocturnal dyspnea.  No edema.  No cough or wheezing.  No heat or  cold intolerance, no dysuria or urgency.  No overt depression and anxiety.  No new joint problems.  No nausea or vomiting.  Does have chronic diarrhea  with incontinence and wears briefs for this.   PHYSICAL EXAMINATION:  GENERAL APPEARANCE:  Alert and oriented, talkative,  in no acute distress or shortness of breath on exam.  VITAL SIGNS:  Temperature 97.7, blood pressure 160/82, pulse 90,  respirations 20, O2 saturations 95% on nonrebreather mask being changed to  O2 per nasal cannula and will be rechecked.  Patient appears quite  comfortable.  Weight 222 pounds equals 100 kg.  HEENT:  Normocephalic and atraumatic.  EOM intact.  Sclerae are clear.  Nares without discharge.  Mouth:  Moist mucous membranes.  NECK:  Supple without lymphadenopathy or JVD.  LUNGS:  Crackles at bases bilaterally.  CARDIOVASCULAR:  Regular rate and rhythm.  No rub.  ABDOMEN:  Markedly obese,  diminished bowel sounds.  Some right-sided  tenderness.  Positive ventral hernia especially when sitting up.  GU:  Foley catheter in place.  RECTAL:  Deferred.  SKIN:  Warm and dry, no rashes or lesions.  EXTREMITIES:  No clubbing, cyanosis, or edema.  No rashes or petechia.  ACCESS:  Right upper arm graft, positive thrill.  MUSCULOSKELETAL:  No overt joint deformities or effusions.  NEUROLOGIC:  Alert and oriented x3.  Cranial nerves II-XII intact.  No  asterixis.   Chest x-ray for [facility] consistent with congestive heart failure.  Head CT at [facility] negative for acute change.   LABORATORY DATA:  Hemoglobin 12.7, MCV 105; white blood count differential  within normal limits.   ASSESSMENT/PLAN:  1. Hypoglycemic episode in a type 2 obese diabetic with less than optimal      control.  Last hemoglobin A1c was 8.5, precipitating causes most likely      variants in diet and timing of the insulin yesterday.  She received      empiric vancomycin and Rocephin at [facility].  Her diet is very      poor, high in simple carbohydrates.  I do not see any evidence of      infection at this time.  Will obtain consult with the diabetes      coordinator and I have personally talked to her about this patient.  2. Congestive heart failure.  Dialysis today.  Her dry weight has been      lowered.  She is at her new dry weight today.  We will challenge this      and reevaluate.  3. End-stage renal disease.  Continue Monday, Wednesday, Friday dialysis      schedule.  4. Secondary hyperparathyroidism with hypercalcemia.  Patient is on a 2.25      calcium bath.  Zemplar is on hold as previously mentioned.  Continue      binders and Sensipar.  5. Type 2 diabetes mellitus.  Will resume insulin but at 20 units of 70/30      b.i.d. and initiate diet education.  At the very least, she should be      drinking diet sodas. 6. Anemia.  Hemoglobin was checked twice at [facility],  first time  was 13.3, two hours later it was 15.6.  Will hold Epogen for now.  It      had recently been decreased in [place], [place].  7. Ventral hernia.  Patient was to have seen Dr. Derrell Lolling tomorrow.  This      has been rescheduled to December 14, 2005.  We may consider consulting him while she is in the hospital.  8. Chronic diarrhea.  Imodium p.r.n.  Questionable due to diabetes and is      this possibly contributing to her weight loss which I believe is      unintentional.      Weston Settle, P.A.      Mindi Slicker. Lowell Guitar, M.D.  Electronically Signed    MB/MEDQ  D:  11/26/2005  T:  11/26/2005  Job:  [id number]

## 2010-07-28 NOTE — Discharge Summary (Signed)
[place]  Patient:    [name], [name] Visit Number: [id number] MRN: [id number]          Service Type: MED Location: [phone] Attending Physician:  Almetta Lovely Dictated by:   Amada Jupiter, P.A. Admit Date:  03/07/2001 Discharge Date: 03/14/2001   CC:         Caralee Ates, M.D.  [facility]  [place] A. Orlin Hilding, M.D.   Discharge Summary  ADMISSION DIAGNOSES: 1. Left upper extremity swelling felt secondary to central vein occlusion. 2. End-stage renal disease on chronic hemodialysis. 3. Insulin-dependent diabetes mellitus. 4. Secondary hyperparathyroidism. 5. Iron-deficiency anemia.  DISCHARGE DIAGNOSES: 1. Left subclavian vein stenosis, status post ligation of left arm    arteriovenous Gore-Tex graft.  Left axillary vein occlusion. 2. Status post placement of a new right upper arm arteriovenous Gore-Tex graft    on March 11, 2001. 3. Status post right internal jugular Diatek catheter placement. 4. End-stage renal disease on chronic hemodialysis. 5. Intermittent spells of unresponsiveness, felt secondary to narcotics,    workup negative. 6. Insulin-dependent diabetes mellitus. 7. Iron-deficiency anemia. 8. Secondary hyperparathyroidism. 9. Cholelithiasis as seen on CT scan.  HISTORY OF PRESENT ILLNESS:  The patient is a 55 year old black female with end-stage renal disease secondary to diabetic nephropathy who presents with acute onset of left arm swelling and pain.  She has a history of left central vein occlusion, and is status post angioplasty at [facility] x3.  The last one was approximately four weeks ago.  She presents to the emergency room after dialysis.  ADMISSION LABORATORY DATA:  Hemoglobin 12.5, white count 7900, platelets 243,000.  Sodium 142, potassium 4.1, chloride 96, CO2 36, glucose 289, BUN 23, creatinine 5.0, calcium 9.5.  Liver function tests within normal  limits.  HOSPITAL COURSE:  #1 - CENTRAL VEIN OCCLUSION:  The patient underwent a venogram showing a long segment occlusion of the left axillary and subclavian veins.  These were not amenable to percutaneous treatment.  No central venous occlusion or hemodynamically significant stenosis was seen on the right side.  Vascular surgery was consulted, and they proceeded with ligation of the left arm AV Gore-Tex graft.  At the same time, a new right upper arm AV Gore-Tex graft and a right IJ Diatek Perm-Cath were placed without complications.  With left arm elevation, her swelling dramatically improved.  The catheter did not function well, and ultimately was found to have a kink at the most superior aspect of the catheter.  This required some manipulation, and then ultimately worked well on the last dialysis treatment prior to discharge with blood flows of 400.  Her right arm was sore, but had a strong bruit in it, and it will be used hopefully three weeks after insertion.  #2 - INTERMITTENT SPELLS OF UNRESPONSIVENESS:  On the second hospital day the patient was found to be unresponsive even with sternal rub.  Vital signs were all within normal limits, and a glucose of 223.  During this same episode, she then gradually began opening her eyes, but would not follow any commands. This prompted a neurology workup.  CAT scan of the head was negative.  EEG showed diffuse slowing, consistent with toxic metabolic effects.  With symptoms of unresponsiveness, all narcotics were held.  She improved without any further intervention, and had no recurrence of symptoms.  It was felt to be secondary to medication, and workup was negative.  DISCHARGE MEDICATIONS: 1. NPH insulin 70/30 25  units q.a.m. and 28 units q.p.m. 2. Nephro-Vite vitamin one daily. 3. Reglan 5 mg a.c. and h.s. (new). 4. Renagel 800 mg three with each meal. 5. Elavil 25 mg at bedtime. 6. Darvocet-N 100 p.r.n. pain. 7. Calcijex 0.5 mcg  IV each dialysis. 8. Infed 50 mcg IV every Friday in dialysis. 9. Epogen 2500 units IV each dialysis.  New dry weight 104 kg.  WOUND CARE:  She was instructed to keep her right arm elevated. Dictated by:   Amada Jupiter, P.A. Attending Physician:  Almetta Lovely DD:  05/21/01 TD:  05/23/01 Job: [id number]

## 2010-07-28 NOTE — H&P (Signed)
NAMEPIERRE, [name]                ACCOUNT NO.:  [id number]   MEDICAL RECORD NO.:  [id number]          PATIENT TYPE:  INP   LOCATION:  5525                         FACILITY:  [facility]   PHYSICIAN:  Peggye Pitt, M.D. DATE OF BIRTH:  [birth year]   DATE OF ADMISSION:  05/05/2006  DATE OF DISCHARGE:                              HISTORY & PHYSICAL   DATE OF ADMISSION:  May 05, 2006, at 7:30 p.m.   HISTORY OF PRESENT ILLNESS:  [name] is a 55 year old African  American woman with end-stage renal disease who dialyzes Monday,  Wednesday, and Friday at [facility] who is transferred from [facility].  She has been having upper respiratory tract infection  symptoms for about 3 days, mostly consistent with runny nose, cough with  whitish sputum production, and some shortness of breath.  She denies any  fever, chills, or chest pain.  She was found to be influenza A positive  and found to have a PO2 of 49 on an arterial blood gas and transferred  here for further care.   PAST MEDICAL HISTORY:  1. End-stage renal disease.  Hemodialysis on Monday, Wednesday, and      Friday at [place].  2. Type 2 diabetes.  3. Hypertension.  4. Secondary hypoparathyroidism.   HOME MEDICATIONS:  Include:  1. Humulin 70/30 insulin 25 units in the morning and 25 units at      night.  2. Renagel 1334 mg 3 t.i.d. with meals.  3. Atenolol 25 mg daily.  4. Motrin p.r.n.   ALLERGIES:  No known drug allergies.   VITAL SIGNS UPON ADMISSION:  Blood pressure 154/75, heart rate 91,  temperature 98.1, O2 sat 97% on 2 liters oxygen via nasal cannula.  Her  CBG was 127.   PHYSICAL EXAMINATION:  GENERAL:  Alert, awake, and oriented times 3.  Obese female.  Not in acute distress.  CARDIOVASCULAR:  Regular rate and rhythm.  No murmurs, rubs, or gallops.  LUNGS:  Mild scattered rhonchi.  ABDOMEN:  Obese, soft, nontender, and nondistended, with positive bowel  sounds.  EXTREMITIES:  No edema, with  positive pulses.   LABORATORY DATA:  Labs done at [facility]:  Sodium 143, potassium  3.8, chloride 104, bicarb 23, BUN 57, creatinine 12, glucose 99, WBC  6.3, hemoglobin 8.7, platelets of 206.  Influenza nasal swab A positive,  B negative.  ABG, which I believe was a venous gas, showed a pH of 7.43,  CO2 of 41, O2 of 59, and a bicarb of 27.   A chest x-ray at [facility] showed no acute cardiopulmonary  disease, with cardiomegaly.  A CT angiogram of the chest, again done at  [facility], showed no PE, significant collateral vessels in the left chest  consistent with left subclavian vein high-grade stenosis/occlusion.   ASSESSMENT AND PLAN:  1. Influenza with about 3 days of symptoms.  Window of opportunity for      Tamiflu has passed.  We will treat symptomatically, place on      droplet precautions.  2. End-stage renal disease.  Hemodialysis on Monday, Wednesday, and  Friday.  We will schedule hemodialysis for tomorrow and obtain      records from the [facility] in the morning.  3. Type 2 diabetes.  Place on home dose of 70/30.  Check CBGs before      each meal and nightly.  4. Hypertension.  Her blood pressure is elevated.  We will keep her on      her home dose of Atenolol and monitor for now.  5. Anemia.  Again, unsure of her baseline.  We will guaiac her stools,      check iron studies, and start Aranesp.  The patient reports at the      dialysis center they ordered her to check some stool cards.  Again,      we will follow up with the results from the kidney center.  6. Secondary hyperparathyroidism.  Continue binders.  We will check      calcium and phosphorus with hemodialysis labs in the morning.      Peggye Pitt, M.D.  Electronically Signed     EH/MEDQ  D:  05/05/2006  T:  05/06/2006  Job:  [id number]

## 2010-07-28 NOTE — Op Note (Signed)
[place]  Patient:    [name], [name] Visit Number: [id number] MRN: [id number]          Service Type: MED Location: [phone] Attending Physician:  Almetta Lovely Dictated by:   Caralee Ates, M.D. Proc. Date: 03/11/01 Admit Date:  03/07/2001   CC:         CVTS office   Operative Report  PREOPERATIVE DIAGNOSIS:  End-stage renal disease.  POSTOPERATIVE DIAGNOSIS:  End-stage renal disease.  PROCEDURES: 1. Right upper arm arteriovenous graft (6-mm PTFE). 2. Right internal jugular Diatek catheter placement. 3. Ligation of left upper arm arteriovenous graft.  SURGEON:  Caralee Ates, M.D.  ASSISTANT:  Areta Haber, P.A.  ANESTHESIA:  General endotracheal.  ESTIMATED BLOOD LOSS:  50 cc.  DRAINS:  None.  SPECIMENS:  None.  COMPLICATIONS:  None.  BRIEF HISTORY:  This is a 55 year old black female with end-stage renal disease secondary to diabetic neuropathy who has left upper arm AV graft through which she has been dialyzed for 18 months.  She has developed progressive left upper extremity swelling and was found to have a proximal central venous stenosis in the left subclavian system.  She was felt to need a new graft for her ongoing dialysis needs and ligation of the graft in the left upper arm to potentially improve the swelling in that arm.  DESCRIPTION OF PROCEDURE:  The patient was brought to the operating room and placed on the operating table in the supine position.  Following adequate general endotracheal anesthesia, the right arm was draped and prepped circumferentially from the axilla to the hand.  An area on the medial aspect of the upper arm just above the elbow was selected, and a longitudinal incision was made.  The wound was deepened using Bovie to control bleeding. The brachial artery was identified at this level, encircled proximally and distally with vessel loops.  Next, a second incision was made on  the upper arm just below the axilla on the medial aspect of the arm.  The wound was deepened using Bovie to control bleeding.  The brachial vein at this level was identified and dissected circumferentially an encircled with vessel loops proximally and distally.  Next, a counter incision was made, and a 6-mm PTFE graft was tunneled in a subcutaneous plane in a looped fashion laterally.  The patient was the systemically heparinized.  Following adequate three-minute circulation time, the vessel loops around the brachial artery were tightened to occlude flow.  A longitudinal arteriotomy was performed.  The PTFE graft was then spatulated and sewn into position in and end-to-side fashion with running 6-0 PTFE suture.  A clamp was then placed on the graft, and the vessel loops were released.  At this point, the patient was have a palpable pulse to the wrist.  No significant bleeding was seen from the anastomotic suture line. Next, the vessel loops around the brachial vein were tightened to occlude flow.  A longitudinal venotomy was performed.  The PTFE graft was then trimmed to the appropriate length, spatulated, and sewn into position in an end-to-side fashion with running 6-0 PTFE suture.  Prior to completion of this most of the graft was flushed, and then the vein was backbled.  The anastomosis was then completed, and the clamp was released restoring flow. There was an excellent thrill in the graft at this point, and excellent Doppler flow at the level of the wrist; however, the pulse was not as strongly palpable  as preoperatively.  There was some augmentation with compression of the graft.  Given the fact that the Doppler flow at the level of the wrist and the radial and ulnar systems was very brisk without compression of the graft, I felt like the level of augmentation provided by compressing the graft was not so great that it would suggest that she would have significant  steel postoperatively.  Next, 30 mg of protamine was administered, and hemostasis was achieved in both wounds.  The wounds were then irrigated copiously with 1% saline and closed in layers with 3-0 and 4-0 Vicryl suture.  Sterile dry dressings were applied.  Next, the right neck was draped and prepped in sterile fashion.  The right internal jugular vein was then percutaneously punctured, and a guide wire was passed down into the SVC without difficulty under direct fluoroscopic vision.  Next, serial dilatation was performed, and a large caliber Peel-Away sheath and introducer was placed over the wire under direction vision using fluoroscopy.  The Diatek catheter was then advanced down the Peel-Away sheath, as the Peel-Away sheath was removed, into position. Next, the exit site on the chest was selected, and a small stab incision was made.  The catheter was then tunneled in a retrograde fashion.  The ports were then applied, and both were found to flush and aspirate easily.  The position of the catheter again was confirmed by fluoroscopy, and there was no evidence of kink at the insertion site at the vein.  The ports were then instilled with concentrated heparin solution, and the catheter was secured to the chest wall with interrupted nylon suture.  The upper wound was closed with interrupted 4v suture.  Sterile dry dressings were applied.  Next, the left upper arm was draped and prepped in sterile fashion.  A small transverse incision was made over the venous outflow tract of the graft.  The wound was deepened using Bovie.  The graft was dissected circumferentially, and it was ligated twice with 2-0 silk suture x 2.  There was no flow distal to the ligation and a pulse in the proximal graft.  The wound was then irrigated and closed in layers with 3-0 and 4-0 Vicryl suture.  Sterile dry dressings were applied. The patient was then awakened from anesthesia and transferred to the recovery room  in stable condition.  The patient tolerated the procedure well.  There  were no complications.  All needle and sponge counts were correct. Dictated by:   Caralee Ates, M.D. Attending Physician:  Almetta Lovely DD:  03/11/01 TD:  03/11/01 Job: [id number]

## 2010-07-28 NOTE — Op Note (Signed)
   [name], [name]                            ACCOUNT NO.:  [id number]   MEDICAL RECORD NO.:  [id number]                   PATIENT TYPE:  OIB   LOCATION:  2869                                 FACILITY:  [facility]   PHYSICIAN:  Larina Earthly, M.D.                 DATE OF BIRTH:  10/17/1955   DATE OF PROCEDURE:  10/23/2002  DATE OF DISCHARGE:                                 OPERATIVE REPORT   PREOPERATIVE DIAGNOSIS:  End-stage renal disease with occluded right upper  arm AV Gore-Tex graft.   POSTOPERATIVE DIAGNOSIS:  End-stage renal disease with occluded right upper  arm AV Gore-Tex graft.   PROCEDURE:  Thrombectomy, interposition jump graft revision of right upper  arm AV Gore-Tex graft.   SURGEON:  Larina Earthly, M.D.   ASSISTANT:  Coral Ceo, P.A.   ANESTHESIA:  MAC.   COMPLICATIONS:  None.   DISPOSITION:  To recovery room stable.   DESCRIPTION OF PROCEDURE:  The patient was taken to the operating room,  placed in supine position where the area of the right arm and right axilla  were prepped and draped in the usual sterile fashion. An incision was made  over the axillary anastomosis and was carried down to isolate the graft to  axillary vein. The patient had several different axillary veins patent. The  anastomosis itself was exposed and Cooley clamp was passed around the  axillary  vein. The graft was opened near the venous anastomosis and there  was a tight stenosis at the venous anastomosis. The graft itself and the  arterialized anastomosis were easily thrombectomized. The arterialized plug  was removed and excellent inflow was encountered. This was flushed with  heparinized saline and reoccluded. A new interposition graft with 6 mm Gore-  Tex was brought onto the field and was sewn end-to-end to the old graft with  a running 6-0 Prolene suture and then end-to-end to the higher axillary vein  with a running 6-0 Prolene suture. Clamps were removed and excellent  thrill  was noted. The wound was irrigated with saline, hemostasis was obtained with  electrocautery. The wounds were closed with 3-0 Vicryl in the subcutaneous  and subcuticular tissue, Benzoin and Steri-Strips were applied.                                               Larina Earthly, M.D.    TFE/MEDQ  D:  10/23/2002  T:  10/24/2002  Job:  [id number]

## 2010-07-28 NOTE — Op Note (Signed)
[name], BUCHHOLZ                            ACCOUNT NO.:  [id number]   MEDICAL RECORD NO.:  [id number]                   PATIENT TYPE:  OIB   LOCATION:  2855                                 FACILITY:  [facility]   PHYSICIAN:  Janetta Hora. Fields, MD               DATE OF BIRTH:  Sep 01, 1955   DATE OF PROCEDURE:  04/05/2003  DATE OF DISCHARGE:  04/05/2003                                 OPERATIVE REPORT   PROCEDURE:  Thrombectomy and revision of right upper arm arteriovenous  graft.   PREOPERATIVE DIAGNOSIS:  Thrombosed arteriovenous graft with end-stage renal  disease.   POSTOPERATIVE DIAGNOSIS:  Thrombosed arteriovenous graft with end-stage  renal disease.   ANESTHESIA:  Local with IV sedation.   ASSISTANT:  Jerold Coombe, P.A.   FINDINGS:  1. Venous intimal hyperplastic narrowing of venous anastomosis.  2. 6 mm PTFE graft, interposition graft.   OPERATIVE DETAILS:  After obtaining informed consent, the patient was taken  to the operating room.  The patient was placed in the supine position on the  operating table.  Next, the patient's entire right upper extremity was  prepped and draped in the usual sterile fashion.  Local anesthesia was  infiltrated near a pre-existing axillary incision.  This incision was then  reopened and carried down through the subcutaneous tissues down to the level  of the pre-existing graft.  The graft was dissected free circumferentially  and controlled with a vessel loop.  The dissection then proceeded down onto  the axillary vein at the level of the venous anastomosis.  Approximately 3  cm of vein was dissected free circumferentially for a distal control.  Next,  a transverse graftotomy was made, and the venous limb was thrombectomized  with a #4 Fogarty catheter.  The venous anastomosis was then probed and  found to easily accept a 3 mm coronary dilator; however, it would not accept  a 3.5 mm dilator.  At this point, the venous anastomosis was  taken down.  There was noted to be intimal hyperplastic narrowing.  The graft was then  transected, and a Gregory clamp was used to control the axillary vein  distally.  The patient was given 5000 units of heparin.  The vein was  slightly spatulated to remove all intimal hyperplastic tissue.  A 6 mm PTFE  graft was then brought up in the operating field and beveled.  This was then  sewn end-to-end to the axillary vein using a running 6-0 Prolene suture.  The arterial limb of the graft was them thrombectomized with a #4 Fogarty  catheter until excellent inflow was established.  The graft was then clamped  just proximal to this, and an endograft anastomosis was created using a  running 6-0 Prolene suture.  Prior to completion of the anastomosis, the  usual flushing procedures were performed. After completion of the  anastomosis, hemostasis was obtained.  There was  noted to be a palpable  thrill within the graft.  The subcutaneous tissues were then closed using a  running Vicryl suture.  The skin was then closed with a 4-0 Vicryl  subcuticular stitch.  The patient tolerated the procedure well, and there  were no complications.  There was an audible radial signal at the end of the  case.                                               Janetta Hora. Fields, MD    CEF/MEDQ  D:  04/06/2003  T:  04/07/2003  Job:  [id number]

## 2010-07-28 NOTE — Op Note (Signed)
[name], [name]                            ACCOUNT NO.:  [id number]   MEDICAL RECORD NO.:  [id number]                   PATIENT TYPE:  OIB   LOCATION:  5708                                 FACILITY:  [facility]   PHYSICIAN:  Angelia Mould. Derrell Lolling, M.D.             DATE OF BIRTH:  07/24/1955   DATE OF PROCEDURE:  05/13/2003  DATE OF DISCHARGE:                                 OPERATIVE REPORT   PREOPERATIVE DIAGNOSIS:  Chronic cholecystitis with cholelithiasis.   POSTOPERATIVE DIAGNOSIS:  Chronic cholecystitis with cholelithiasis.   OPERATION PERFORMED:  Laparoscopic cholecystectomy with intraoperative  cholangiogram.   SURGEON:  Angelia Mould. Derrell Lolling, M.D.   FIRST ASSISTANT:  Rose Phi. Maple Hudson, M.D.   OPERATIVE INDICATIONS:  This is a 55 year old black female with end-stage  renal disease on dialysis. She is diabetic. She has been having a several  week course of abdominal pain, nausea and vomiting. Gallstones were noted on  CT scan dated 02/09/2003. Her liver function tests are normal. She had  dialysis yesterday. She was brought to the hospital electively for  cholecystectomy.   OPERATIVE FINDINGS:  The gallbladder was somewhat discolored and chronically  inflamed. The cystic duct was small but patent. The cholangiogram showed  normal findings. Intrahepatic and extrahepatic biliary anatomy was normal.  There was no filling defect. There was prompt flow of contrast into the  duodenum. No other abnormalities. The omentum was huge and obscured  visualization of the GI tract. The peritoneal surfaces were normal.   OPERATIVE TECHNIQUE:  Following the induction of general endotracheal  anesthesia, the patient's abdomen was prepped and draped in sterile fashion.  Then 0.5% Marcaine with epinephrine was used as a local infiltration  anesthetic.   A transverse incision was made about 6 cm above and slightly to the right of  the umbilicus. A 10 mm Optiview port was passed through the abdominal  wall  under direct vision with the 0 degree scope in place. We entered the  peritoneal cavity without much difficulty and insufflated the abdomen to 15  mmHg. We looked around with the findings as described above.   A 10 mm trocar was placed in the subxiphoid region and two 5 mm trocars  placed in the right upper quadrant. We identified the gallbladder and  elevated it. It was quite tense and so we had to use a suction trocar to  aspirate the gallbladder. We then placed the gallbladder on traction and  dissected out the cystic duct and the cystic artery. A metal clip was placed  on the cystic duct close to the gallbladder after we had created a large  window behind the cystic duct. A cholangiogram catheter was inserted and a  cholangiogram was obtained using the C-arm. The cholangiogram looked good  and showed normal intrahepatic and extrahepatic biliary anatomy, no filling  defects and good flow of contrast into the duodenum. The cholangiogram  catheter was  removed. The cystic duct was secured with multiple metal clips  and divided. The cystic artery was secured with multiple metal clips and  divided. We found a posterior branch of the cystic artery going up the  gallbladder, we secured it with metal clips and divided it as well. The  gallbladder was dissected from its bed and placed in a specimen bag and  removed.   The operative field was copiously irrigated. We had spilled some bile during  the case so we irrigated with 2000 cubic centimeters of saline until the  irrigation fluid was completely clear. At the completion of the case there  was no bleeding and no bile leak whatsoever. The trocars were removed under  direct vision and there was no bleeding from the trocar sites.  Pneumoperitoneum was released. The skin incisions were closed with  subcuticular sutures of 4-0 Vicryl and Steri-Strips. Clean bandages were  placed and the patient was taken to the recovery room in stable  condition.   Estimated blood loss was about 15 cubic centimeters. Complications - none.  Sponge, needle and instrument counts were correct.                                               Angelia Mould. Derrell Lolling, M.D.    HMI/MEDQ  D:  05/13/2003  T:  05/13/2003  Job:  [id number]   cc:   Wilber Bihari. Caryn Section, M.D.  [address]  [place]  [place] [address]  Fax: [phone]   Malachy Mood, M.D.

## 2010-07-28 NOTE — H&P (Signed)
[name], [name]                            ACCOUNT NO.:  [id number]   MEDICAL RECORD NO.:  [id number]                   PATIENT TYPE:  INP   LOCATION:  5035                                 FACILITY:  [facility]   PHYSICIAN:  Angelia Mould. Derrell Lolling, M.D.             DATE OF BIRTH:  [birth year]   DATE OF ADMISSION:  05/21/2003  DATE OF DISCHARGE:                                HISTORY & PHYSICAL   CHIEF COMPLAINT:  Abdominal pain and vomiting, and syncopal episode.   HISTORY OF PRESENT ILLNESS:  This is a 55 year old black female with end-  stage renal disease, insulin-dependent diabetes, and hypertension. She  developed gallstones and was symptomatic from that. She underwent uneventful  elective laparoscopic cholecystectomy with cholangiogram on May 13, 2003.  Pathology report showed chronic cholecystitis with cholelithiasis. The  cholangiogram was unremarkable. The surgery was uneventful. She was  discharged home on May 15, 2003, after her dialysis. She reports daily  vomiting since going home. She reports upper abdominal discomfort since  yesterday. She continued to take her insulin despite her vomiting. She  became unresponsive in the middle of the night and was taken to the [facility] emergency department at about 2:30 a.m. by her family. They found  that her blood glucose was 11 and she was treated for hyperglycemic episode.  She did wake up. The [facility] emergency room called Dr. Fayrene Fearing  Deterding who accepted the patient in transfer for evaluation of abdominal  pain.  Dr. Darrick Penna asked me to see the patient.   PAST HISTORY:  1. Bilateral foot surgeries by Dr. Samuel Bouche in [facility] due to her diabetes.  2. Laparoscopic tubal ligation.  3. Laparoscopic cholecystectomy on May 13, 2003.  4. Functioning AV shunt in her right arm, status post AV shunt in the left     arm which is nonfunctioning.  5. End-stage renal disease times four and a half years.  6. Hypertension.  7. Insulin-dependent diabetes mellitus.  8. Known diastasis recti.   CURRENT MEDICATIONS:  1. Insulin 70/30, 30 units in the morning and 30 units in the evening.  2. Renagel 800 mg three tablets t.i.d. with meals.  3. Darvocet N-100 p.r.n. pain.   DRUG ALLERGIES:  None known.   SOCIAL HISTORY:  The patient is married, has two children, and is on  disability. She denies the use of alcohol or tobacco.   FAMILY HISTORY:  Father deceased of unknown cause. Mother living with  diabetes.  Nine brothers and four sisters, and she denies that they have any  healthy problems.   REVIEW OF SYSTEMS:  All systems are reviewed. They are unchanged from  previous H&P and are noncontributory except as described above.   PHYSICAL EXAMINATION:  GENERAL: Pleasant, somewhat child-like acting middle-  age black female in no obvious distress. She is laughing at my questioning.  VITAL SIGNS: Temperature 96.9, blood pressure 185/68, heart  rate 93,  respiratory rate 20.  HEENT: Sclerae clear. Extraocular movements intact. Ears, nose, mouth,  throat, nose, lips, tongue, and oropharynx are without gross lesions. She  does not look that dehydrated.  NECK: Supple, nontender. No mass. No jugular venous distention.  LUNGS: Clear to auscultation. No wheezing or rhonchi.  HEART: Regular rate and rhythm. No murmur.  BREASTS: Not examined.  ABDOMEN: Very obese. She is soft and has active bowel sounds. She does seem  somewhat distended in the upper abdomen and a Laseter tympanitic. She has  mild discomfort to palpate in the upper abdomen, but really no peritoneal  signs. The recent laparoscopic scars are  healing well without any signs or  infection or hernias. There is no palpable mass.  EXTREMITIES: She moves all four extremities well without deformity or pain.  There is AV graft in the right upper arm.  NEUROLOGIC: No gross motor or sensory deficits.   ADMISSION DATA:  Chest x-ray shows no active disease and no  free air under  the diaphragm. Abdominal x-ray shows some gastric distention, but otherwise  normal gas pattern. Hemoglobin 11.5, white blood cell count 9800.  Liver  function tests are normal. Serum amylase and lipase are normal. Potassium  3.9, BUN 31, creatinine 7.9. Her glucose is 194.   ASSESSMENT:  1. Abdominal pain and vomiting. I suspect that this is more likely a     diabetic gastroparesis problem rather than a surgical complication     considering her workup to this point.  Nevertheless, further evaluation     is warranted to be sure.  2. Acute insulin reaction, treated.  3. Insulin-dependent diabetes mellitus.  4. Hypertension.  5. End-stage renal disease.  6. Obesity.   PLAN:  1. The patient will be admitted.  2. The nephrology service has been contacted and I spoke with Dr. Darrick Penna     and he states that they will manage all of her medical problems.  3. We are going to proceed with the CT scan of the abdomen and pelvis to     make sure it is not an abscess.                                                Angelia Mould. Derrell Lolling, M.D.    HMI/MEDQ  D:  05/21/2003  T:  05/22/2003  Job:  [id number]   cc:   Fayrene Fearing L. Deterding, M.D.  [address]  [place]  [place] [address]  Fax: [phone]

## 2010-07-28 NOTE — Op Note (Signed)
[place]  Patient:    [name], [name] Visit Number: [id number] MRN: [id number]          Service Type: DSU Attending Physician:  Ernesto Rutherford Dictated by:   Ernesto Rutherford, M.D. Proc. Date: 06/09/01 Admit Date:  06/09/2001 Discharge Date: 06/10/2001                             Operative Report  PREOPERATIVE DIAGNOSIS: 1. 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 2. Dense vitreous hemorrhage of the left eye. 3. 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 4. Dense nuclear sclerotic cataract, left eye.  POSTOPERATIVE DIAGNOSIS: 1. 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 2. Dense vitreous hemorrhage of the left eye. 3. 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 4. Dense nuclear sclerotic cataract, left eye.  OPERATION PERFORMED: 1. Posterior vitrectomy and membrane peel, left eye. 2. Pars plana vitrectomy with endolaser panretinal photocoagulation, left    eye. 3. Pars plana lensectomy, phacofragmentation, left eye. 4. Insertion of posterior chamber intraocular lens primary, EZE-60 lens    into the sulcus, power +23.5, serial number [id number].  SURGEON:  Ernesto Rutherford, M.D.  ANESTHESIA:  General endotracheal.  INDICATIONS FOR PROCEDURE:  The patient is a 55 year old woman with profound vision loss on the basis of dense neglected vitreous hemorrhage, tractional retinal detachment, nuclear sclerotic cataract of the left eye.  This is an attempt to induce quiescence of proliferative diabetic retinopathy, release tractional retinal detachment of the macula as well as clear the vitreous opacification and remove cataract as well.  The patient understands the risks of anesthesia including the rare occurrence of death.  She specifically requests general endotracheal anesthesia.  In addition she understands the risks to the eye including but not limited to hemorrhage,  infection, scarring, need for another surgery, no change in vision, loss of vision, and progressive disease despite intervention.  DESCRIPTION OF PROCEDURE:  After appropriate signed consent was obtained, the patient was taken to the operating room.  In the operating room general The left periocular region was sterilely prepped and draped in the usual ophthalmic fashion. A lid speculum was applied.  Conjunctival peritomy fashioned temporally and supranasally.  A 4 mm infusion was secured 3.5 mm posterior to the limbus in the infratemporal quadrant.  Placement in the vitreous cavity was verified visually.   Superior sclerotomy was then fashioned.  Wild microscope was placed in position with Biom attached.  Core vitrectomy was then begun.  Iatrogenic posterior vitreous detachment was necessary and it was removed in modified en bloc technique with dense fibrovascular attachments along the optic nerve, temporal arcades and infranasally and infratemporally.  The retina was elevated.  These were cut and transected with curved scissors.  Hemostasis was obtained with cautery as well as endolaser photocoagulation. It was necessary during performing this portion of the procedure to perform cataract extraction.  Nuclear sclerotic changes prohibited adequate visualization.  Posterior capsule was entered with vitrectomy instrumentation. Hydrodelineation and ____________ carried out in the ____________ . Phacofragmentation carried out in the ____________ .  Cortical clean-out carried out in the ____________ .  This allowed for excellent visualization of the remainder of the intraocular portion of the procedure.  After completing the intraocular portion of the procedure posteriorly, a crescent blade was then used to perform a grooved limbal incision.  The anterior chamber was entered with an MVR and deepened with Viscoat.  The limbal wound was opened to allow passage of a model EZ-60 lens.  It was  placed in the sulcus, rotated to the horizontal position with excellent stability and positioning.  Interrupted 10-0 nylon sutures times four were used to close the limbal wound superiorly.  Irrigation of the Viscoat was carried out copiously. At this time fluid-air exchange was then completed to maintain retinal reattachment and minimize recurrence of the vitreous hemorrhage.  Endolaser photocoagulation was placed around the site of the retinal break suspected in the supratemporal arcade.  At this time instruments were removed from the eye. Superior sclerotomies closed with 7-0 Vicryl suture.  Infusion removed and similarly closed with 7-0 Vicryl suture.  Conjunctiva closed with 7-0 Vicryl suture.  Subconjunctival injections of antibiotic and steroid were applied. The patient tolerated the procedure well without complication. Dictated by:   Ernesto Rutherford, M.D. Attending Physician:  Ernesto Rutherford DD:  06/09/01 TD:  06/10/01 Job: [id number]

## 2010-07-28 NOTE — Discharge Summary (Signed)
[name], [name]                            ACCOUNT NO.:  [id number]   MEDICAL RECORD NO.:  [id number]                   PATIENT TYPE:  INP   LOCATION:  5035                                 FACILITY:  [facility]   PHYSICIAN:  Angelia Mould. Derrell Lolling, M.D.             DATE OF BIRTH:  1956-02-20   DATE OF ADMISSION:  05/21/2003  DATE OF DISCHARGE:  05/24/2003                                 DISCHARGE SUMMARY   FINAL DIAGNOSES:  1. Vomiting and abdominal pain secondary to diabetic gastroparesis.  2. End-stage renal disease on hemodialysis.  3. Insulin reaction.  4. Insulin-dependent diabetes mellitus.  5. Hypertension.  6. Obesity.  7. Status post recent laparoscopic cholecystectomy.   OPERATION PERFORMED:  None.   HISTORY:  This is a 55 year old black female with end-stage renal disease,  insulin-dependent diabetes mellitus, hypertension.  She underwent uneventful  laparoscopic cholecystectomy with cholangiogram on May 13, 2003, and went  home on May 15, 2003.  She comes back to the emergency room on the date of  this admission complaining of daily vomiting and upper abdominal discomfort  since March 10.  She was found to be unresponsive at home and was taken to  the [facility] emergency department at 2:30 a.m. on the day of this  admission, where her blood glucose was reported to be 11.  She was treated  for insulin reaction, did become arousable and was transferred here at the  request of Dr.  Darrick Penna to evaluate her abdominal pain.   PHYSICAL EXAMINATION:  GENERAL:  Obese black female, laughing and in minimal  distress.  VITAL SIGNS:  T 96.9, BP 155/68, P 93, R 20.  NECK:  Supple, nontender.  No jugular venous distention.  LUNGS:  Clear to auscultation.  No wheezing or rhonchi.  HEART:  Regular rate and rhythm, no murmur.  ABDOMEN:  Obese, soft, with active bowel sounds.  Slightly distended in  upper abdomen and a Troup bit tympanitic.  No peritoneal signs.  Laparoscopic  wounds are healing well without any sign of infection or  hernia.  No palpable mass.   ADMISSION DATA:  Chest x-ray showed no active disease and no free air.  Abdominal x-ray showed some gastric distention.  White count was 9,800,  hemoglobin 11.5, liver function tests normal.  Serum amylase and serum  lipase are normal.  BUN 31, creatinine 7.9, potassium 3.9, glucose 194.   HOSPITAL COURSE:  The patient was admitted for observation.  A CT scan of  the abdomen and pelvis was unremarkable except for postoperative changes.  The stomach was distended.  Patient was seen in consultation by Dr.  Fayrene Fearing  Deterding in the nephrology service.  They managed her hemodialysis.   It was my feeling that she had had an insulin reaction and had diabetic  gastroparesis.  She was placed on Reglan and Protonix and did well.  Over  the next couple  of days, we advanced her diet and activities, and she had no  problems  with that.  She was discharged on May 24, 2003.  She was eating well,  denied pain or nausea.  Abdomen was soft and totally benign on exam.  She  was asked to follow up with me in two weeks.  She was to continue her  hemodialysis on schedule.                                                Angelia Mould. Derrell Lolling, M.D.    HMI/MEDQ  D:  06/27/2003  T:  06/28/2003  Job:  [id number]   cc:   Fayrene Fearing L. Deterding, M.D.  [address]  [place]  [place] [address]  Fax: [phone]

## 2010-07-28 NOTE — Discharge Summary (Signed)
NAMEBRYTTANI, [name]                ACCOUNT NO.:  [id number]   MEDICAL RECORD NO.:  [id number]          PATIENT TYPE:  INP   LOCATION:  5525                         FACILITY:  [facility]   PHYSICIAN:  Peggye Pitt, M.D. DATE OF BIRTH:  07-16-1955   DATE OF ADMISSION:  05/05/2006  DATE OF DISCHARGE:  05/06/2006                               DISCHARGE SUMMARY   DISCHARGE DIAGNOSES:  1. Influenza A.  2. End-stage renal disease on hemodialysis.  3. Type 2 diabetes.  4. Hypertension.  5. Secondary hyperparathyroidism.   DISCHARGE MEDICATIONS:  Include:  1. Humulin 70/30 insulin 25 units in the morning and 25 units at      night.  2. Renagel 800 mg 3 tablets 3 times a day with meals.  3. Atenolol, which has been increased 25 to 50 mg daily.   DISPOSITION AND FOLLOWUP:  The patient will be discharged from the  hospital in stable condition and she will followup on her dialysis  center, which is in [place] on Wednesday for her regular dialysis  treatment.  At that time, her blood pressure should be monitored to make  sure that she does not become hypotensive during dialysis on the  increased dose of atenolol.   BRIEF HISTORY AND PHYSICAL EXAM:  For full details, please refer to the  chart.  [name] is a 55 year old African American woman, transferred  from [facility], was found to be influenza A positive by nasal  swab, was found to have a PO2 of 49 on an ABG and was transferred here  for further care.  Again, her past medical history is significant for  end-stage renal disease, type 2 diabetes, hypertension and secondary  hyperparathyroidism.   VITAL SIGNS:  Upon admission blood pressure 154/75.  Heart rate 91.  Temperature 98.1.  O2 saturations 97% on 2 liters O2.  Her CBG was 127.  GENERAL:  She was awake, alert and oriented x3.  Obese.  Not in acute  distress.  CARDIOVASCULAR:  Regular rate and rhythm with no murmurs, rubs or  gallops.  LUNGS:  She had some mild  scattered rhonchi.  ABDOMEN:  Obese, soft, nontender, nondistended with positive bowel  sounds.  EXTREMITIES:  No edema with positive pulses.   Labs upon admission, and these were done at [facility], showed a  sodium of 143, potassium 3.8, chloride 104, bicarb 23, BUN 56,  creatinine 12.0, glucose of 99.  WBC 6.3, hemoglobin 8.7 and a glucose  of 206.  A chest x-ray there showed no acute cardiopulmonary disease and  cardiomegaly.  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   HOSPITAL COURSE:  1. For her influenza.  Treating her symptomatic.  2. For her end-stage renal disease.  She dialyses at the [facility] Monday, Wednesday and Friday.  We will dialyze here      on Monday with the same parameters that she dialyzes at her kidney  center and she will be discharged in the morning.  3. Questionable hypoxia.  An ABG drawn here at [facility]      also shows a PO2 of 50; however, I believe this again is venous      because it does not adequately correlate with an O2 saturation of      95-97% on room air and in a patient that does not look clinically      hypoxic.  She does not have shortness of breath or increased work      of breathing.  4. For her type 2 diabetes.  We initially placed her on her home dose      of 70/30 insulin, which is 25 units in the morning and in the      evening; however, the morning after admission, she was      hypoglycemic, so we decided to hold her morning dose of insulin.      This is probably related to her poor p.o. intake.  The patient is      advised, before discharge, that if she cannot keep up with her p.o.      intake, then she should decrease the doses of her insulin and her      CBG should be monitored when she goes to outpatient dialysis.  5. Hypertension.  Her blood pressure remained  elevated, so what we did      was we increased her atenolol from 25 to 50 mg daily.  This needs      to be monitored as an outpatient to make sure she does not become      hypotensive with dialysis.  6. Her anemia.  Again unsure of her baseline.  We have checked iron      studies, which are pending at the time of this dictation.  We      started her on her home dose of Epogen, converted Aranesp.  7. Secondary hyperparathyroidism.  We continued her on her phosphate      binders.  Calcium and phosphorus are pending from today's labs as      well.   VITAL SIGNS:  On day of discharge, blood pressure 150/73.  Heart rate  79.  Temperature 98.1.  O2 saturations 95% on room air.      Peggye Pitt, M.D.  Electronically Signed     EH/MEDQ  D:  05/06/2006  T:  05/06/2006  Job:  [id number]

## 2010-07-28 NOTE — Consult Note (Signed)
[facility]  Patient:    [name], [name] Visit Number: [id number] MRN: [id number]          Service Type: MED Location: [phone] Attending Physician:  Almetta Lovely Dictated by:   Gustavus Messing Orlin Hilding, M.D. Proc. Date: 03/09/01 Admit Date:  03/07/2001   CC:         Dyke Maes, M.D.   Consultation Report  CHIEF COMPLAINT:  Intermittent spells of unresponsiveness.  HISTORY OF PRESENT ILLNESS:  Ms. Wolman is a 55 year old woman with diabetes, end-stage renal disease on hemodialysis, anemia, and a left arm recurrent central vein occlusion.  She was admitted two days ago for upper extremity venogram because of pain and swelling in the left upper extremity and was found to have long segment occlusion in the left axillary and subclavian vein, some patent collaterals.  She needs to have definitive treatment of either a thrombectomy or tying off and a new graft created but that has not yet been done.  She has had some low blood pressures in the 90-120 systolic and 25-50 diastolic range and had been having frequent emesis since last night with low O2 saturations and several spells of unresponsiveness.  She was receiving Tylenol No. 3 but the last dose was last night.  She had a spell in the middle of the night, recovered spontaneously.  Had a spell this morning, again recovered spontaneously.  Was talking to friends and family and then had another spell about 2:00.  She was given Narcan at about 2:30 and seemed to come out of it; however, since she has had no narcotics since the middle of the night and had a lucid interval in between it is felt that narcosis is not as likely.  She has had a total of three events as best I can tell.  REVIEW OF SYSTEMS:  Positive for some nausea, vomiting, left arm pain earlier.  PAST MEDICAL HISTORY:  Significant for diabetes, hypertension, end-stage renal disease on hemodialysis, left axillary  subclavian vein occlusion, nausea and vomiting.  MEDICATIONS:  Narcan x 1, Tylenol No. 3 now discontinued, Epogen, Renagel, insulin, Nephro-Vite, Elavil, calcitriol, Phenergan.  ALLERGIES:  No known drug allergies.  SOCIAL HISTORY:  No cigarette use.  No alcohol use.  She is married.  Lives with her husband and mother, I believe.  FAMILY HISTORY:  Positive for hypertension and diabetes.  PHYSICAL EXAMINATION  VITAL SIGNS:  Temperature 98.7, pulse 120, blood pressure 155/79.  Currently, when she started in dialysis it was 90/28.  Pulse saturations 92% on 100% nonrebreather, however, on the blood gas she has 100% saturation with ______ higher O2.  LUNGS:  Clear to auscultation as best I can hear them anteriorly.  NECK:  Supple without bruits.  Could not get a good listen to the right.  HEART:  regular rate and rhythm, but rapid.  EXTREMITIES:  She has some amputated toes and left upper extremity edema.  NEUROLOGIC:  Mental status:  She is lethargic, but will respond to voice by opening her eyes and will answer a few questions.  She can tell me her name and her age.  She is oriented to place, [facility] dialysis unit.  She is oriented to month December but when asked for the year she perseverates on the month for a while and then eventually says 2000 when it is 2002.  Language content in terms of structure was otherwise normal.  She drifts off easily. Cranial nerves:  Pupils were  small.  I could not see her disk margins.  Visual fields are full.  Does not appear to have any field cuts.  She tracks well. She follows commands well with her face.  No facial asymmetry.  Her tongue is midline without laceration.  No dysarthria.  Motor examination:  She has good grips bilaterally.  She is moving all extremities to command with apparent full strength, though I cannot really assess the left because of the swelling and the dialysis ______ at present time.  Some tremors versus asterixis  or myoclonus are noted in the left greater than right hand.  Reflexes are unobtainable in the upper extremities, 2+ deep tendon reflexes at the knees. Cannot get them at the ankles.  Toes are mute to plantar stimulation. Coordination:  Unable to assess.  Sensory examination:  She will withdraw to pain.  LABORATORIES:  CT of the brain is essentially normal.  Large cavum septum pellucidum noted incidentally.  Blood gas shows a PCO2 slightly elevated at 52, PO2 205, 100% saturation. White blood cell count 7.5, hemoglobin 12.4, hematocrit 37.4, platelets 229,000.  Sodium 138, potassium 4.9, chloride 97, CO2 32, BUN 36, creatinine 7.1, glucose 204.  Creatinine two days ago was 4.9.  IMPRESSION:  Episodic altered mentation, rule out seizures, rule out toxic metabolic encephalopathy.  There is some waxing and waning, but overall a nonfocal examination.  RECOMMENDATIONS:  EEG in a.m.  Would have a low threshold for treatment with Dilantin if clinically definite seizures occur.  Check ammonia level.  Avoid CNS acting medications. Dictated by:   Gustavus Messing Orlin Hilding, M.D. Attending Physician:  Almetta Lovely DD:  03/09/01 TD:  03/09/01 Job: [id number]

## 2010-08-01 ENCOUNTER — Other Ambulatory Visit: Payer: Self-pay | Admitting: Internal Medicine

## 2010-08-01 DIAGNOSIS — I6529 Occlusion and stenosis of unspecified carotid artery: Secondary | ICD-10-CM

## 2010-08-02 ENCOUNTER — Encounter (INDEPENDENT_AMBULATORY_CARE_PROVIDER_SITE_OTHER): Payer: Medicare Other | Admitting: *Deleted

## 2010-08-02 DIAGNOSIS — I6529 Occlusion and stenosis of unspecified carotid artery: Secondary | ICD-10-CM

## 2010-08-04 ENCOUNTER — Encounter: Payer: Self-pay | Admitting: Internal Medicine

## 2010-12-05 ENCOUNTER — Inpatient Hospital Stay (HOSPITAL_COMMUNITY): Payer: Medicare Other

## 2010-12-05 ENCOUNTER — Inpatient Hospital Stay (HOSPITAL_COMMUNITY)
Admission: AD | Admit: 2010-12-05 | Discharge: 2010-12-11 | DRG: 296 | Disposition: E | Payer: Medicare Other | Source: Other Acute Inpatient Hospital | Attending: Internal Medicine | Admitting: Internal Medicine

## 2010-12-05 DIAGNOSIS — N186 End stage renal disease: Secondary | ICD-10-CM | POA: Diagnosis present

## 2010-12-05 DIAGNOSIS — E872 Acidosis, unspecified: Secondary | ICD-10-CM | POA: Diagnosis present

## 2010-12-05 DIAGNOSIS — E782 Mixed hyperlipidemia: Secondary | ICD-10-CM

## 2010-12-05 DIAGNOSIS — E119 Type 2 diabetes mellitus without complications: Secondary | ICD-10-CM | POA: Diagnosis present

## 2010-12-05 DIAGNOSIS — I469 Cardiac arrest, cause unspecified: Principal | ICD-10-CM | POA: Diagnosis present

## 2010-12-05 DIAGNOSIS — E785 Hyperlipidemia, unspecified: Secondary | ICD-10-CM | POA: Diagnosis present

## 2010-12-05 DIAGNOSIS — E039 Hypothyroidism, unspecified: Secondary | ICD-10-CM | POA: Diagnosis present

## 2010-12-05 DIAGNOSIS — L97509 Non-pressure chronic ulcer of other part of unspecified foot with unspecified severity: Secondary | ICD-10-CM | POA: Diagnosis present

## 2010-12-05 DIAGNOSIS — G936 Cerebral edema: Secondary | ICD-10-CM | POA: Diagnosis present

## 2010-12-05 DIAGNOSIS — Z66 Do not resuscitate: Secondary | ICD-10-CM | POA: Diagnosis present

## 2010-12-05 DIAGNOSIS — Z6841 Body Mass Index (BMI) 40.0 and over, adult: Secondary | ICD-10-CM

## 2010-12-05 DIAGNOSIS — I120 Hypertensive chronic kidney disease with stage 5 chronic kidney disease or end stage renal disease: Secondary | ICD-10-CM | POA: Diagnosis present

## 2010-12-05 DIAGNOSIS — R791 Abnormal coagulation profile: Secondary | ICD-10-CM | POA: Diagnosis present

## 2010-12-05 DIAGNOSIS — E875 Hyperkalemia: Secondary | ICD-10-CM | POA: Diagnosis present

## 2010-12-05 DIAGNOSIS — M7989 Other specified soft tissue disorders: Secondary | ICD-10-CM

## 2010-12-05 DIAGNOSIS — G931 Anoxic brain damage, not elsewhere classified: Secondary | ICD-10-CM | POA: Diagnosis present

## 2010-12-05 DIAGNOSIS — I369 Nonrheumatic tricuspid valve disorder, unspecified: Secondary | ICD-10-CM

## 2010-12-05 DIAGNOSIS — J9600 Acute respiratory failure, unspecified whether with hypoxia or hypercapnia: Secondary | ICD-10-CM | POA: Diagnosis present

## 2010-12-05 DIAGNOSIS — Z992 Dependence on renal dialysis: Secondary | ICD-10-CM

## 2010-12-05 DIAGNOSIS — I2510 Atherosclerotic heart disease of native coronary artery without angina pectoris: Secondary | ICD-10-CM | POA: Diagnosis present

## 2010-12-05 LAB — BLOOD GAS, ARTERIAL
Drawn by: 317771
O2 Saturation: 97.4 %
PEEP: 5 cmH2O
RATE: 20 resp/min
pCO2 arterial: 35.2 mmHg (ref 35.0–45.0)
pO2, Arterial: 92 mmHg (ref 80.0–100.0)

## 2010-12-05 LAB — BASIC METABOLIC PANEL
BUN: 50 mg/dL — ABNORMAL HIGH (ref 6–23)
BUN: 52 mg/dL — ABNORMAL HIGH (ref 6–23)
BUN: 53 mg/dL — ABNORMAL HIGH (ref 6–23)
BUN: 54 mg/dL — ABNORMAL HIGH (ref 6–23)
BUN: 55 mg/dL — ABNORMAL HIGH (ref 6–23)
BUN: 56 mg/dL — ABNORMAL HIGH (ref 6–23)
CO2: 26 mEq/L (ref 19–32)
CO2: 22 mEq/L (ref 19–32)
CO2: 21 mEq/L (ref 19–32)
Calcium: 9.4 mg/dL (ref 8.4–10.5)
Calcium: 9.7 mg/dL (ref 8.4–10.5)
Calcium: 9.6 mg/dL (ref 8.4–10.5)
Chloride: 107 mEq/L (ref 96–112)
Creatinine, Ser: 11.15 mg/dL — ABNORMAL HIGH (ref 0.50–1.10)
Creatinine, Ser: 10.9 mg/dL — ABNORMAL HIGH (ref 0.50–1.10)
Creatinine, Ser: 10.88 mg/dL — ABNORMAL HIGH (ref 0.50–1.10)
Creatinine, Ser: 10.83 mg/dL — ABNORMAL HIGH (ref 0.50–1.10)
Creatinine, Ser: 11.45 mg/dL — ABNORMAL HIGH (ref 0.50–1.10)
GFR calc Af Amer: 4 mL/min — ABNORMAL LOW (ref >60)
GFR calc Af Amer: 4 mL/min — ABNORMAL LOW (ref >60)
GFR calc Af Amer: 4 mL/min — ABNORMAL LOW (ref >60)
GFR calc Af Amer: 4 mL/min — ABNORMAL LOW (ref >60)
GFR calc non Af Amer: 4 mL/min — ABNORMAL LOW (ref >60)
GFR calc non Af Amer: 4 mL/min — ABNORMAL LOW (ref >60)
GFR calc non Af Amer: 3 mL/min — ABNORMAL LOW (ref >60)
Glucose, Bld: 335 mg/dL — ABNORMAL HIGH (ref 70–99)
Glucose, Bld: 110 mg/dL — ABNORMAL HIGH (ref 70–99)
Potassium: 2.9 mEq/L — ABNORMAL LOW (ref 3.5–5.1)

## 2010-12-05 LAB — POCT I-STAT 3, ART BLOOD GAS (G3+)
Acid-base deficit: 1 mmol/L (ref 0.0–2.0)
Bicarbonate: 22.4 mEq/L (ref 20.0–24.0)
pCO2 arterial: 27.1 mmHg — ABNORMAL LOW (ref 35.0–45.0)
pO2, Arterial: 47 mmHg — ABNORMAL LOW (ref 80.0–100.0)

## 2010-12-05 LAB — LACTIC ACID, PLASMA: Lactic Acid, Venous: 4.7 mmol/L — ABNORMAL HIGH (ref 0.5–2.2)

## 2010-12-05 LAB — CARDIAC PANEL(CRET KIN+CKTOT+MB+TROPI)
CK, MB: 10.8 ng/mL (ref 0.3–4.0)
CK, MB: 15.1 ng/mL (ref 0.3–4.0)
Relative Index: 3.2 — ABNORMAL HIGH (ref 0.0–2.5)
Total CK: 403 U/L — ABNORMAL HIGH (ref 7–177)
Total CK: 472 U/L — ABNORMAL HIGH (ref 7–177)
Total CK: 706 U/L — ABNORMAL HIGH (ref 7–177)
Troponin I: 1.05 ng/mL (ref <0.30)

## 2010-12-05 LAB — APTT
aPTT: 30 seconds (ref 24–37)
aPTT: 35 seconds (ref 24–37)

## 2010-12-05 LAB — CBC
MCH: 30.6 pg (ref 26.0–34.0)
MCHC: 32.5 g/dL (ref 30.0–36.0)
Platelets: 122 10*3/uL — ABNORMAL LOW (ref 150–400)

## 2010-12-05 LAB — URINALYSIS, MICROSCOPIC ONLY
Bilirubin Urine: NEGATIVE
Ketones, ur: 15 mg/dL — AB
Nitrite: NEGATIVE
Protein, ur: >300 mg/dL — AB
Specific Gravity, Urine: 1.012 (ref 1.005–1.030)
Urobilinogen, UA: 0.2 mg/dL (ref 0.0–1.0)

## 2010-12-05 LAB — GLUCOSE, CAPILLARY
Glucose-Capillary: 251 mg/dL — ABNORMAL HIGH (ref 70–99)
Glucose-Capillary: 310 mg/dL — ABNORMAL HIGH (ref 70–99)
Glucose-Capillary: 219 mg/dL — ABNORMAL HIGH (ref 70–99)
Glucose-Capillary: 260 mg/dL — ABNORMAL HIGH (ref 70–99)
Glucose-Capillary: 185 mg/dL — ABNORMAL HIGH (ref 70–99)
Glucose-Capillary: 157 mg/dL — ABNORMAL HIGH (ref 70–99)

## 2010-12-05 LAB — HCG, QUANTITATIVE, PREGNANCY: hCG, Beta Chain, Quant, S: 2 m[IU]/mL (ref <5)

## 2010-12-05 LAB — PROTIME-INR: INR: 1.18 (ref 0.00–1.49)

## 2010-12-05 LAB — MAGNESIUM: Magnesium: 2.1 mg/dL (ref 1.5–2.5)

## 2010-12-06 ENCOUNTER — Inpatient Hospital Stay (HOSPITAL_COMMUNITY): Payer: Medicare Other

## 2010-12-06 DIAGNOSIS — I469 Cardiac arrest, cause unspecified: Secondary | ICD-10-CM

## 2010-12-06 DIAGNOSIS — N186 End stage renal disease: Secondary | ICD-10-CM

## 2010-12-06 DIAGNOSIS — J9600 Acute respiratory failure, unspecified whether with hypoxia or hypercapnia: Secondary | ICD-10-CM

## 2010-12-06 LAB — BLOOD GAS, ARTERIAL
MECHVT: 500 mL
RATE: 14 resp/min
pCO2 arterial: 47.8 mmHg — ABNORMAL HIGH (ref 35.0–45.0)
pH, Arterial: 7.345 — ABNORMAL LOW (ref 7.350–7.400)
pO2, Arterial: 92.2 mmHg (ref 80.0–100.0)

## 2010-12-06 LAB — CBC
HCT: 34.9 % — ABNORMAL LOW (ref 36.0–46.0)
MCH: 30.5 pg (ref 26.0–34.0)
MCV: 90.9 fL (ref 78.0–100.0)
RBC: 3.84 MIL/uL — ABNORMAL LOW (ref 3.87–5.11)
RDW: 13.6 % (ref 11.5–15.5)
WBC: 8.5 10*3/uL (ref 4.0–10.5)

## 2010-12-06 LAB — RENAL FUNCTION PANEL
BUN: 15 mg/dL (ref 6–23)
CO2: 26 mEq/L (ref 19–32)
Calcium: 9.1 mg/dL (ref 8.4–10.5)
Calcium: 8.8 mg/dL (ref 8.4–10.5)
Creatinine, Ser: 3.74 mg/dL — ABNORMAL HIGH (ref 0.50–1.10)
GFR calc Af Amer: 15 mL/min — ABNORMAL LOW (ref >60)
Glucose, Bld: 137 mg/dL — ABNORMAL HIGH (ref 70–99)
Glucose, Bld: 157 mg/dL — ABNORMAL HIGH (ref 70–99)
Phosphorus: 1.9 mg/dL — ABNORMAL LOW (ref 2.3–4.6)
Sodium: 140 mEq/L (ref 135–145)

## 2010-12-06 LAB — BASIC METABOLIC PANEL
BUN: 28 mg/dL — ABNORMAL HIGH (ref 6–23)
BUN: 31 mg/dL — ABNORMAL HIGH (ref 6–23)
BUN: 34 mg/dL — ABNORMAL HIGH (ref 6–23)
BUN: 10 mg/dL (ref 6–23)
CO2: 26 mEq/L (ref 19–32)
CO2: 26 mEq/L (ref 19–32)
CO2: 26 mEq/L (ref 19–32)
Calcium: 8.6 mg/dL (ref 8.4–10.5)
Calcium: 8.5 mg/dL (ref 8.4–10.5)
Chloride: 101 mEq/L (ref 96–112)
Chloride: 100 mEq/L (ref 96–112)
Chloride: 100 mEq/L (ref 96–112)
Chloride: 95 mEq/L — ABNORMAL LOW (ref 96–112)
Chloride: 99 mEq/L (ref 96–112)
Creatinine, Ser: 6.49 mg/dL — ABNORMAL HIGH (ref 0.50–1.10)
Creatinine, Ser: 6.66 mg/dL — ABNORMAL HIGH (ref 0.50–1.10)
Creatinine, Ser: 2.38 mg/dL — ABNORMAL HIGH (ref 0.50–1.10)
Creatinine, Ser: 4 mg/dL — ABNORMAL HIGH (ref 0.50–1.10)
GFR calc Af Amer: 8 mL/min — ABNORMAL LOW (ref >60)
GFR calc Af Amer: 8 mL/min — ABNORMAL LOW (ref >60)
GFR calc Af Amer: 7 mL/min — ABNORMAL LOW (ref >60)
GFR calc non Af Amer: 6 mL/min — ABNORMAL LOW (ref >60)
Glucose, Bld: 157 mg/dL — ABNORMAL HIGH (ref 70–99)
Potassium: 6.8 mEq/L (ref 3.5–5.1)
Potassium: 5.6 mEq/L — ABNORMAL HIGH (ref 3.5–5.1)
Sodium: 140 mEq/L (ref 135–145)
Sodium: 138 mEq/L (ref 135–145)

## 2010-12-06 LAB — URINE CULTURE
Colony Count: NO GROWTH
Culture  Setup Time: 201209251325

## 2010-12-06 LAB — POCT I-STAT 3, ART BLOOD GAS (G3+)
Acid-Base Excess: 2 mmol/L (ref 0.0–2.0)
Bicarbonate: 26 mEq/L — ABNORMAL HIGH (ref 20.0–24.0)
Bicarbonate: 25.4 mEq/L — ABNORMAL HIGH (ref 20.0–24.0)
pH, Arterial: 7.517 — ABNORMAL HIGH (ref 7.350–7.400)
pH, Arterial: 7.272 — ABNORMAL LOW (ref 7.350–7.400)
pO2, Arterial: 42 mmHg — ABNORMAL LOW (ref 80.0–100.0)

## 2010-12-06 LAB — GLUCOSE, CAPILLARY
Glucose-Capillary: 116 mg/dL — ABNORMAL HIGH (ref 70–99)
Glucose-Capillary: 94 mg/dL (ref 70–99)
Glucose-Capillary: 103 mg/dL — ABNORMAL HIGH (ref 70–99)
Glucose-Capillary: 135 mg/dL — ABNORMAL HIGH (ref 70–99)
Glucose-Capillary: 181 mg/dL — ABNORMAL HIGH (ref 70–99)
Glucose-Capillary: 250 mg/dL — ABNORMAL HIGH (ref 70–99)

## 2010-12-07 ENCOUNTER — Inpatient Hospital Stay (HOSPITAL_COMMUNITY): Payer: Medicare Other

## 2010-12-07 LAB — CBC
HCT: 39.3
HCT: 41.8
Hemoglobin: 10 g/dL — ABNORMAL LOW (ref 12.0–15.0)
Hemoglobin: 12.5
Hemoglobin: 14
MCH: 30.8 pg (ref 26.0–34.0)
MCV: 92.6 fL (ref 78.0–100.0)
MCV: 88.7
Platelets: 129 10*3/uL — ABNORMAL LOW (ref 150–400)
Platelets: 224
RBC: 3.25 MIL/uL — ABNORMAL LOW (ref 3.87–5.11)
RBC: 4.35
RBC: 4.72
WBC: 10.8 10*3/uL — ABNORMAL HIGH (ref 4.0–10.5)
WBC: 10.7 — ABNORMAL HIGH
WBC: 7.8

## 2010-12-07 LAB — POCT I-STAT 3, ART BLOOD GAS (G3+)
Acid-Base Excess: 3 mmol/L — ABNORMAL HIGH (ref 0.0–2.0)
Bicarbonate: 27.2 mEq/L — ABNORMAL HIGH (ref 20.0–24.0)
Patient temperature: 99.4
TCO2: 28 mmol/L (ref 0–100)
pH, Arterial: 7.461 — ABNORMAL HIGH (ref 7.350–7.400)
pO2, Arterial: 90 mmHg (ref 80.0–100.0)

## 2010-12-07 LAB — RENAL FUNCTION PANEL
Albumin: 2.5 g/dL — ABNORMAL LOW (ref 3.5–5.2)
Albumin: 3 — ABNORMAL LOW
Albumin: 3.4 — ABNORMAL LOW
BUN: 15
BUN: 22
CO2: 27 mEq/L (ref 19–32)
CO2: 27
CO2: 28
CO2: 29
CO2: 30
CO2: 30
CO2: 30
Calcium: 8.4 mg/dL (ref 8.4–10.5)
Calcium: 8.1 — ABNORMAL LOW
Calcium: 9.1
Chloride: 100
Chloride: 102
Chloride: 102
Chloride: 98
Chloride: 99
Chloride: 99
Creatinine, Ser: 5.08 mg/dL — ABNORMAL HIGH (ref 0.50–1.10)
Creatinine, Ser: 6.51 — ABNORMAL HIGH
Creatinine, Ser: 6.55 — ABNORMAL HIGH
Creatinine, Ser: 7.88 — ABNORMAL HIGH
GFR calc Af Amer: 11 mL/min — ABNORMAL LOW (ref >60)
GFR calc Af Amer: 5 — ABNORMAL LOW
GFR calc Af Amer: 5 — ABNORMAL LOW
GFR calc Af Amer: 6 — ABNORMAL LOW
GFR calc Af Amer: 6 — ABNORMAL LOW
GFR calc Af Amer: 8 — ABNORMAL LOW
GFR calc non Af Amer: 9 mL/min — ABNORMAL LOW (ref >60)
GFR calc non Af Amer: 4 — ABNORMAL LOW
GFR calc non Af Amer: 5 — ABNORMAL LOW
GFR calc non Af Amer: 5 — ABNORMAL LOW
GFR calc non Af Amer: 7 — ABNORMAL LOW
Glucose, Bld: 123 — ABNORMAL HIGH
Glucose, Bld: 208 — ABNORMAL HIGH
Glucose, Bld: 219 — ABNORMAL HIGH
Glucose, Bld: 237 — ABNORMAL HIGH
Phosphorus: 3.3
Phosphorus: 4.6
Phosphorus: 4.7 — ABNORMAL HIGH
Potassium: 4.2
Potassium: 4.2
Potassium: 4.3
Potassium: 4.9
Potassium: 6.4
Sodium: 141 mEq/L (ref 135–145)
Sodium: 138
Sodium: 141
Sodium: 141
Sodium: 142
Sodium: 142

## 2010-12-07 LAB — BASIC METABOLIC PANEL
BUN: 32 — ABNORMAL HIGH
Chloride: 97
GFR calc Af Amer: 5 — ABNORMAL LOW
GFR calc non Af Amer: 5 — ABNORMAL LOW
Potassium: 3.9
Sodium: 139

## 2010-12-07 LAB — DIFFERENTIAL
Eosinophils Relative: 3
Lymphocytes Relative: 18
Lymphs Abs: 1.4
Monocytes Relative: 7

## 2010-12-07 LAB — POCT I-STAT 4, (NA,K, GLUC, HGB,HCT)
Glucose, Bld: 195 — ABNORMAL HIGH
Hemoglobin: 16.7 — ABNORMAL HIGH
Potassium: 4.6
Sodium: 142

## 2010-12-07 LAB — APTT: aPTT: 91 seconds — ABNORMAL HIGH (ref 24–37)

## 2010-12-07 LAB — GLUCOSE, CAPILLARY
Glucose-Capillary: 118 mg/dL — ABNORMAL HIGH (ref 70–99)
Glucose-Capillary: 125 mg/dL — ABNORMAL HIGH (ref 70–99)

## 2010-12-07 LAB — PROTIME-INR
INR: 1.48 (ref 0.00–1.49)
Prothrombin Time: 13.2

## 2010-12-07 LAB — HEPARIN LEVEL (UNFRACTIONATED): Heparin Unfractionated: 0.29 IU/mL — ABNORMAL LOW (ref 0.30–0.70)

## 2010-12-08 ENCOUNTER — Inpatient Hospital Stay (HOSPITAL_COMMUNITY): Payer: Medicare Other

## 2010-12-08 LAB — CBC
MCH: 30.3 pg (ref 26.0–34.0)
Platelets: 114 10*3/uL — ABNORMAL LOW (ref 150–400)
RBC: 2.87 MIL/uL — ABNORMAL LOW (ref 3.87–5.11)
RDW: 14.2 % (ref 11.5–15.5)
WBC: 8.4 10*3/uL (ref 4.0–10.5)

## 2010-12-08 LAB — GLUCOSE, CAPILLARY
Glucose-Capillary: 106 mg/dL — ABNORMAL HIGH (ref 70–99)
Glucose-Capillary: 126 mg/dL — ABNORMAL HIGH (ref 70–99)
Glucose-Capillary: 91 mg/dL (ref 70–99)
Glucose-Capillary: 62 mg/dL — ABNORMAL LOW (ref 70–99)
Glucose-Capillary: 72 mg/dL (ref 70–99)
Glucose-Capillary: 84 mg/dL (ref 70–99)

## 2010-12-08 LAB — BASIC METABOLIC PANEL
BUN: 52 mg/dL — ABNORMAL HIGH (ref 6–23)
CO2: 24 mEq/L (ref 19–32)
Calcium: 8 mg/dL — ABNORMAL LOW (ref 8.4–10.5)
Chloride: 102 mEq/L (ref 96–112)
Chloride: 100 mEq/L (ref 96–112)
Creatinine, Ser: 6.48 mg/dL — ABNORMAL HIGH (ref 0.50–1.10)
Creatinine, Ser: 7.37 mg/dL — ABNORMAL HIGH (ref 0.50–1.10)
GFR calc Af Amer: 8 mL/min — ABNORMAL LOW (ref >60)
GFR calc non Af Amer: 7 mL/min — ABNORMAL LOW (ref >60)
Glucose, Bld: 73 mg/dL (ref 70–99)
Potassium: 3.8 mEq/L (ref 3.5–5.1)

## 2010-12-08 LAB — HEPARIN LEVEL (UNFRACTIONATED)
Heparin Unfractionated: 0.19 IU/mL — ABNORMAL LOW (ref 0.30–0.70)
Heparin Unfractionated: 0.3 IU/mL (ref 0.30–0.70)

## 2010-12-08 NOTE — Procedures (Signed)
EEG NUMBER:  HISTORY:  A 55 year old female status post PEA arrest, evaluated for anoxic brain injury.  MEDICATIONS:  Aspirin, Aranesp, Lantus, NovoLog, Lopressor, Zocor, Pepcid, Unasyn, and heparin.  CONDITION OF RECORDING:  This is a 16 channel EEG carried with the patient in the unresponsive state.  DESCRIPTION:  The background activity is undetectable during the tracing.  Initial sensitivity is a 7 microvolts per millimeter.  Despite decrease in sensitivity to 5 microvolts per millimeter no background activity can be evaluated other than artifact.  Sensitivity was not decreased to 3 microvolts per millimeter.  Hypoventilation was not performed.  Intermittent photic stimulation failed with any change in the tracing.  No evidence of drowse or sleep were noted.  IMPRESSION:  This is an abnormal EEG secondary to the absence of background activity consistent with a diagnosis of anoxic brain injury. No epileptiform activity was noted.          ______________________________ Thana Farr, MD    [id number] D:  12/08/2010 17:59:05  T:  12/08/2010 21:11:04  Job #:  [id number]

## 2010-12-09 ENCOUNTER — Inpatient Hospital Stay (HOSPITAL_COMMUNITY): Payer: Medicare Other

## 2010-12-09 DIAGNOSIS — G9340 Encephalopathy, unspecified: Secondary | ICD-10-CM

## 2010-12-09 LAB — RENAL FUNCTION PANEL
Albumin: 2.1 g/dL — ABNORMAL LOW (ref 3.5–5.2)
BUN: 59 mg/dL — ABNORMAL HIGH (ref 6–23)
Calcium: 8 mg/dL — ABNORMAL LOW (ref 8.4–10.5)
Creatinine, Ser: 8.09 mg/dL — ABNORMAL HIGH (ref 0.50–1.10)
Glucose, Bld: 76 mg/dL (ref 70–99)
Phosphorus: 2.4 mg/dL (ref 2.3–4.6)
Potassium: 3.5 mEq/L (ref 3.5–5.1)

## 2010-12-09 LAB — CBC
HCT: 24.3 % — ABNORMAL LOW (ref 36.0–46.0)
MCH: 30.3 pg (ref 26.0–34.0)
MCHC: 33.3 g/dL (ref 30.0–36.0)
MCV: 91 fL (ref 78.0–100.0)
RDW: 14.3 % (ref 11.5–15.5)

## 2010-12-09 LAB — GLUCOSE, CAPILLARY: Glucose-Capillary: 75 mg/dL (ref 70–99)

## 2010-12-11 LAB — CULTURE, BLOOD (ROUTINE X 2): Culture  Setup Time: 201209251236

## 2010-12-11 NOTE — Consult Note (Addendum)
[name], [name]                ACCOUNT NO.:  [id number]  MEDICAL RECORD NO.:  [id number]  LOCATION:  2914                         FACILITY:  [facility]  PHYSICIAN:  Noralyn Pick. Eden Emms, MD, FACCDATE OF BIRTH:  05-14-1955  DATE OF CONSULTATION:  12/02/2010 DATE OF DISCHARGE:                                CONSULTATION   PRIMARY CARDIOLOGIST:  Pricilla Riffle, MD, [facility]  CHIEF COMPLAINT:  PEA arrest.  HISTORY OF PRESENT ILLNESS:  [name] is a 55 year old female with history of CAD, ischemic cardiomyopathy, end-stage renal disease on dialysis, and diabetes mellitus who called EMS overnight after not feeling well.  Per nurse report, the patient missed hemodialysis yesterday and was not feeling quite right in the evening, so husband called EMS.  She was transferred to [facility] and reportedly lost her pause and went into PEA.  She received epinephrine and CPR for approximately 18-20 minutes per discussion with Pulmonary and Critical Care.  Nursing believes that her PEA recurred once briefly with presumedly return of spontaneous circulation.  She was then intubated for airway protection and transferred to [facility].  Initial lab work is noted at the outside hospital with a white blood cell count of 17, pH of 6.97 with a pO2 of 195 with subsequent pH of 7.120 and CO2 of 76.  Initial EKG shows sinus tach.  Followup EKG read out as accelerated junctional, but does appear to be sinus rhythm, slightly tachycardic with Q-waves in V1-V4.  There is some loss of R-wave progression, but otherwise no acute changes.  Enzymes were pending.  PAST MEDICAL HISTORY: 1. Coronary artery disease.     a.     Catheterization in 1009 showing circumflex and AV groove      with proximal occlusion before the large OM, felt to be chronic      with left-to-left and right-to-left collaterals, 100% proximal,      and 99% mid large OM stenosis.     b.     Negative Myoview for ischemia in January 2012 with  EF of      43%. 2. Ischemic cardiomyopathy with EF of 45% by cath in 2009, 55-60 by     echo in November 2010, most recently 43% by nuclear study January     2012. 3. End-stage renal disease, on hemodialysis.     a.     History of secondary hyperparathyroidism.     b.     History of left arm graft ligation secondary to subclavian      vein stenosis.     c.     Aneurysm/clot with right AV graft, status post      thrombectomy/revision. 4. Hypothyroidism. 5. Diabetes mellitus type 2. 6. Hypertension. 7. Depression. 8. Obesity. 9. Hyperlipidemia. 10.Chronic right foot ulcer.  SURGICAL HISTORY:  As above including thyroidectomy and parathyroidectomy, tooth surgery, cholecystectomy, BTL, knee surgery, and carpal normal.  OUTPATIENT MEDICATIONS:  Med rec is pending.  INPATIENT MEDICATIONS: 1. Aspirin 300 mg PR x1. 2. Lovenox 40 mg daily. 3. Pepcid 20 mg IV q.8 h. 4. Sliding scale insulin.  ALLERGIES:  No known drug allergies.  SOCIAL HISTORY:  [name] lives  in [place].  She is reportedly married, with 2 children and has no ongoing tobacco use or alcohol use. Unable to obtain directly from the patient and no family present.  FAMILY HISTORY:  [name] to be negative for premature coronary artery disease, unable to obtain from the patient.  REVIEW OF SYSTEMS:  Unable to obtain from the patient secondary to ventilation and sedation.  LABORATORY DATA:  WBC 8.5, hemoglobin 9.5, hematocrit 29.2, and platelet count 122.  Glucose 227, initial WBC 17.6 at an outside hospital.  BMET done at outside hospital showed sodium 145, potassium 3.9, chloride 102, CO2 of 20, glucose 245, BUN 46, creatinine 11.34, pH as noted prior. Enzymes are pending.  RADIOLOGIC STUDIES:  Chest x-ray showed diffuse airspace disease bilaterally, left greater than right with question of multifocal pneumonia, asymmetric pulmonary edema or ARDS with borderline cardiomegaly.  PHYSICAL EXAMINATION:  VITAL  SIGNS:  Temperature 99.4, pulse 99, respirations 20, blood pressure 139/64, and pulse ox 98% on ventilator. GENERAL:  An obese African American female in no acute distress. HEENT:  Normocephalic and atraumatic.  Extraocular movements are intact. Clear sclerae.  Nares are without discharge.  She does have periorbital edema. NECK:  Supple. HEART:  Auscultation of the heart reveals regular rhythm, slightly tachycardic without obvious murmurs, rubs, or gallops. LUNGS:  Lung sounds are coarse bilaterally, somewhat rhonchorous. ABDOMEN:  Soft, nontender, and nondistended.  Positive bowel sounds. EXTREMITIES:  Warm, dry, and with trace hand and feet edema, 1+ pedal pulses bilaterally. NEUROLOGICAL:  She is sedated and ventilated and nonverbal.  ASSESSMENT/PLAN:  The patient was seen and examined by Dr. Eden Emms and myself.  This is a 54-year lady with history of coronary artery disease, ischemic cardiomyopathy, end-stage renal disease, and diabetes who presents with an episode of pulseless electrical activity arrest with no obvious cardiac etiology.  We will follow enzymes and we ill also check a D-dimer to rule out pulmonary embolism as an etiology.  Bedside echo will be ordered for today.  Her rhythm and blood pressure are currently stable.  We will hold off on anticoagulation for now given questionable neurologic status after having a 20 minutes of CPR.  We will continue to follow with you.     [name], P.A.C.   ______________________________ Noralyn Pick Eden Emms, MD, [facility]    DD/MEDQ  D:  11/29/2010  T:  11/23/2010  Job:  [id number]  cc:   Pricilla Riffle, MD, [facility]  Electronically Signed by Charlton Haws MD [facility] on 12/11/2010 06:24:06 PM Electronically Signed by Ronie Spies  on 12/18/2010 09:40:35 AM

## 2010-12-11 DEATH — deceased

## 2010-12-14 LAB — CBC
HCT: 36.4 % (ref 36.0–46.0)
HCT: 36.6 % (ref 36.0–46.0)
HCT: 38.2 % (ref 36.0–46.0)
HCT: 39.9 % (ref 36.0–46.0)
Hemoglobin: 12 g/dL (ref 12.0–15.0)
Hemoglobin: 12.2 g/dL (ref 12.0–15.0)
Hemoglobin: 12.2 g/dL (ref 12.0–15.0)
Hemoglobin: 13.2 g/dL (ref 12.0–15.0)
MCHC: 32 g/dL (ref 30.0–36.0)
MCHC: 32.8 g/dL (ref 30.0–36.0)
MCHC: 33.6 g/dL (ref 30.0–36.0)
MCV: 85.5 fL (ref 78.0–100.0)
MCV: 88 fL (ref 78.0–100.0)
MCV: 88.6 fL (ref 78.0–100.0)
Platelets: 125 K/uL — ABNORMAL LOW (ref 150–400)
Platelets: 128 10*3/uL — ABNORMAL LOW (ref 150–400)
Platelets: 132 K/uL — ABNORMAL LOW (ref 150–400)
RBC: 4.11 MIL/uL (ref 3.87–5.11)
RBC: 4.27 MIL/uL (ref 3.87–5.11)
RBC: 4.28 MIL/uL (ref 3.87–5.11)
RBC: 4.57 MIL/uL (ref 3.87–5.11)
RBC: 4.73 MIL/uL (ref 3.87–5.11)
RDW: 17.7 % — ABNORMAL HIGH (ref 11.5–15.5)
RDW: 17.9 % — ABNORMAL HIGH (ref 11.5–15.5)
RDW: 18.2 % — ABNORMAL HIGH (ref 11.5–15.5)
WBC: 6.3 K/uL (ref 4.0–10.5)
WBC: 6.6 10*3/uL (ref 4.0–10.5)
WBC: 6.8 K/uL (ref 4.0–10.5)
WBC: 7.1 10*3/uL (ref 4.0–10.5)

## 2010-12-14 LAB — COMPREHENSIVE METABOLIC PANEL
ALT: 16 U/L (ref 0–35)
AST: 16 U/L (ref 0–37)
Alkaline Phosphatase: 92 U/L (ref 39–117)
CO2: 30 mEq/L (ref 19–32)
Calcium: 7.6 mg/dL — ABNORMAL LOW (ref 8.4–10.5)
GFR calc Af Amer: 6 mL/min — ABNORMAL LOW (ref >60)
GFR calc non Af Amer: 5 mL/min — ABNORMAL LOW (ref >60)
Glucose, Bld: 199 mg/dL — ABNORMAL HIGH (ref 70–99)
Potassium: 4.1 mEq/L (ref 3.5–5.1)
Sodium: 138 mEq/L (ref 135–145)
Total Protein: 6.4 g/dL (ref 6.0–8.3)

## 2010-12-14 LAB — RENAL FUNCTION PANEL
Albumin: 2.8 g/dL — ABNORMAL LOW (ref 3.5–5.2)
BUN: 56 mg/dL — ABNORMAL HIGH (ref 6–23)
Calcium: 8 mg/dL — ABNORMAL LOW (ref 8.4–10.5)
Phosphorus: 4 mg/dL (ref 2.3–4.6)
Potassium: 4.9 mEq/L (ref 3.5–5.1)
Sodium: 133 mEq/L — ABNORMAL LOW (ref 135–145)

## 2010-12-14 LAB — LIPID PANEL
Cholesterol: 81 mg/dL (ref 0–200)
LDL Cholesterol: 41 mg/dL (ref 0–99)

## 2010-12-14 LAB — BASIC METABOLIC PANEL
BUN: 44 mg/dL — ABNORMAL HIGH (ref 6–23)
Calcium: 7.5 mg/dL — ABNORMAL LOW (ref 8.4–10.5)
Creatinine, Ser: 10.85 mg/dL — ABNORMAL HIGH (ref 0.4–1.2)
GFR calc Af Amer: 4 mL/min — ABNORMAL LOW (ref >60)
GFR calc non Af Amer: 4 mL/min — ABNORMAL LOW (ref >60)

## 2010-12-14 LAB — PHOSPHORUS: Phosphorus: 5.5 mg/dL — ABNORMAL HIGH (ref 2.3–4.6)

## 2010-12-14 LAB — GLUCOSE, CAPILLARY
Glucose-Capillary: 156 mg/dL — ABNORMAL HIGH (ref 70–99)
Glucose-Capillary: 177 mg/dL — ABNORMAL HIGH (ref 70–99)
Glucose-Capillary: 197 mg/dL — ABNORMAL HIGH (ref 70–99)
Glucose-Capillary: 209 mg/dL — ABNORMAL HIGH (ref 70–99)
Glucose-Capillary: 215 mg/dL — ABNORMAL HIGH (ref 70–99)
Glucose-Capillary: 229 mg/dL — ABNORMAL HIGH (ref 70–99)
Glucose-Capillary: 259 mg/dL — ABNORMAL HIGH (ref 70–99)
Glucose-Capillary: 77 mg/dL (ref 70–99)

## 2010-12-14 LAB — APTT: aPTT: >200 seconds (ref 24–37)

## 2010-12-14 LAB — CARDIAC PANEL(CRET KIN+CKTOT+MB+TROPI)
CK, MB: 0.7 ng/mL (ref 0.3–4.0)
Relative Index: INVALID (ref 0.0–2.5)
Relative Index: INVALID (ref 0.0–2.5)
Total CK: 67 U/L (ref 7–177)

## 2010-12-14 LAB — HEPATIC FUNCTION PANEL
ALT: 27 U/L (ref 0–35)
Alkaline Phosphatase: 92 U/L (ref 39–117)
Bilirubin, Direct: <0.1 mg/dL (ref 0.0–0.3)
Total Bilirubin: 0.7 mg/dL (ref 0.3–1.2)

## 2010-12-14 LAB — HEPARIN LEVEL (UNFRACTIONATED)
Heparin Unfractionated: 0.79 IU/mL — ABNORMAL HIGH (ref 0.30–0.70)
Heparin Unfractionated: 0.92 IU/mL — ABNORMAL HIGH (ref 0.30–0.70)
Heparin Unfractionated: <0.1 IU/mL — ABNORMAL LOW (ref 0.30–0.70)

## 2010-12-14 LAB — TSH: TSH: 2.573 u[IU]/mL (ref 0.350–4.500)

## 2010-12-18 LAB — BASIC METABOLIC PANEL
BUN: 36 — ABNORMAL HIGH
CO2: 28
GFR calc non Af Amer: 5 — ABNORMAL LOW
Glucose, Bld: 135 — ABNORMAL HIGH
Potassium: 5.9 — ABNORMAL HIGH
Sodium: 143

## 2010-12-18 LAB — T4: T4, Total: 9.8

## 2010-12-18 LAB — TSH: TSH: 2.464

## 2011-01-16 NOTE — Discharge Summary (Signed)
  [name], [name]                ACCOUNT NO.:  [id number]  MEDICAL RECORD NO.:  [id number]  LOCATION:  2914                         FACILITY:  [facility]  PHYSICIAN:  Leslye Peer, MD    DATE OF BIRTH:  [birth year]  DATE OF ADMISSION:  11/16/2010 DATE OF DISCHARGE:  Dec 27, 2010                              DISCHARGE SUMMARY   DEATH SUMMARY  DATE OF DEATH:  27-Dec-2010  FINAL CAUSE OF DEATH:  Hypoxic encephalopathy.  SECONDARY CAUSES OF DEATH: 1. PEA cardiac arrest with CPR for greater than 20 minutes. 2. Acute hypoxemic respiratory failure likely due to volume overload. 3. End-stage renal disease. 4. Coagulopathy. 5. Diabetes mellitus. 6. Morbid obesity.  BRIEF HOSPITAL COURSE:  [name] was a 55 year old woman with morbid obesity, coronary artery disease, and end-stage renal disease on dialysis.  She was reportedly not feeling well on the date of hospital admission, and then had the sudden onset of dyspnea and unresponsiveness.  Her husband called 911 and she was found to be in cardiac arrest, which was reportedly PEA.  She underwent approximately 30 minutes of CPR and was taken to [facility].  She was transferred to [facility] in [place] on the same day, December 05, 2010, for further support and hypothermia protocol.  This was initiated on December 05, 2010.  She was also started on empiric Unasyn on December 06, 2010, for bilateral infiltrates noted on chest x- ray.  It was felt that these likely reflected either aspiration pneumonia/pneumonitis or volume overload because she had skipped date of hemodialysis prior to her decompensation.  When hypothermia was completed and when her sedation was lifted, she unfortunately did not exhibit any meaningful neurological responses.  Her exam was characterized by myoclonic jerking and evidence for profound brain injury.  A CT scan of the brain was performed that showed significant anoxic injury.  It  was discussed with her family that there was likely no hope for a meaningful recovery.  For this reason, she was transitioned off mechanical ventilation and transitioned to a comfort based approach.  She was extubated on Dec 27, 2010, and died later that day.     Leslye Peer, MD    RSB/MEDQ  D:  01/12/2011  T:  01/13/2011  Job:  [id number]  Electronically Signed by Levy Pupa MD on 01/16/2011 12:07:56 PM

## 2011-10-13 IMAGING — CT CT HEAD W/O CM
1 series · 16 of 30 positions shown, 20 images · non-contrast
Comparison: None

CLINICAL DATA: Cardiac arrest.  Not waking up.  End-stage renal
disease.

CT HEAD WITHOUT CONTRAST
TECHNIQUE: Contiguous axial images were obtained from the base of
the skull through the vertex without contrast.

[Series 2: head routine 4.8 h37s · axial · 0.43mm/px · z∈[+139,+269]mm · 16 of 30 slices shown, 20 images]
[im 2/30  brain]
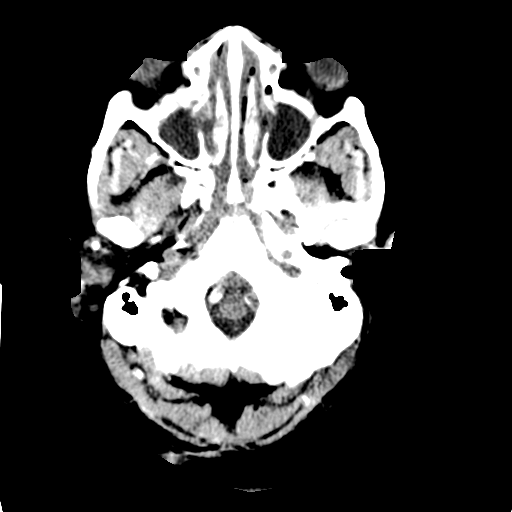
[im 2/30  bone]
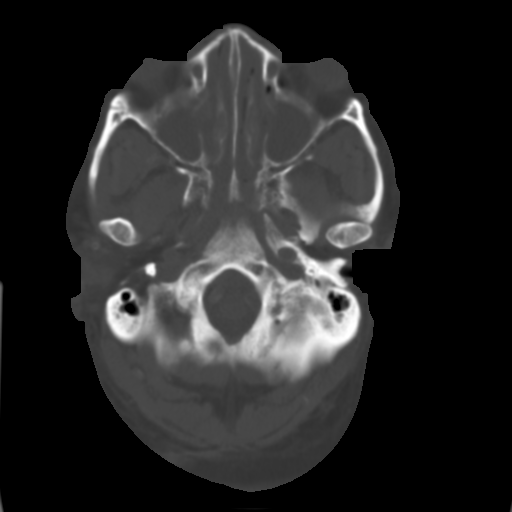
[im 4/30  brain]
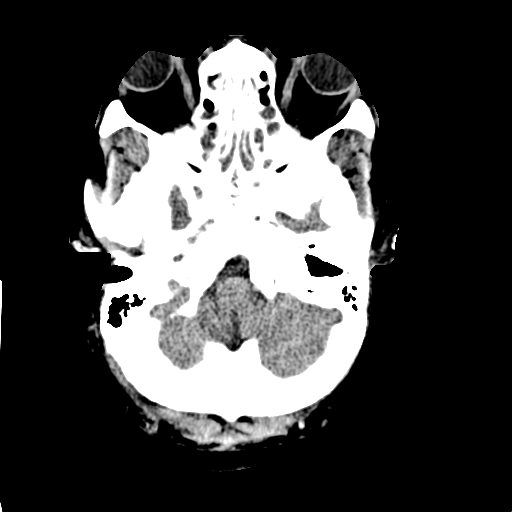
[im 6/30  brain]
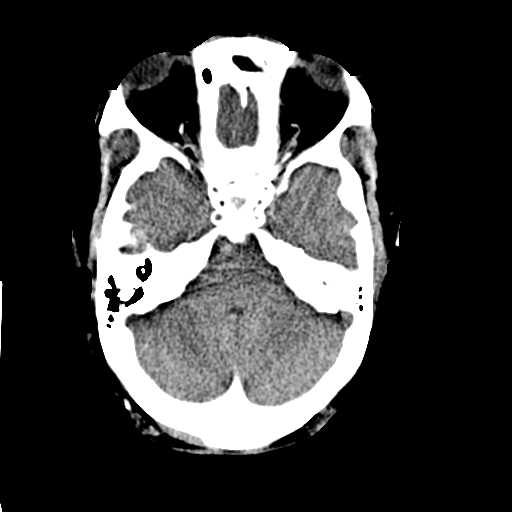
[im 8/30  brain]
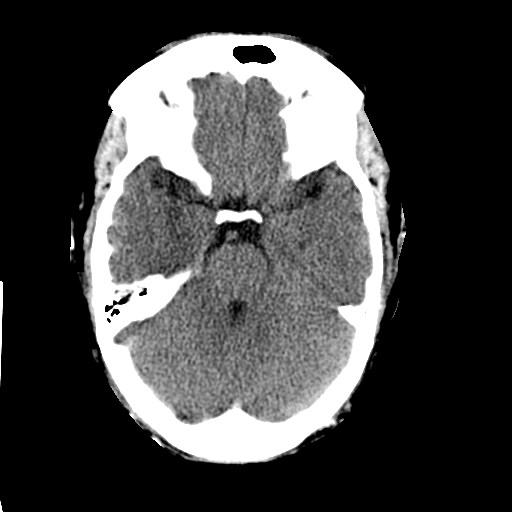
[im 9/30  brain]
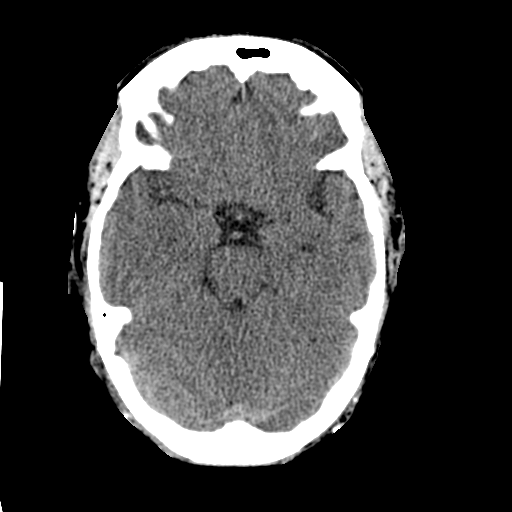
[im 9/30  bone]
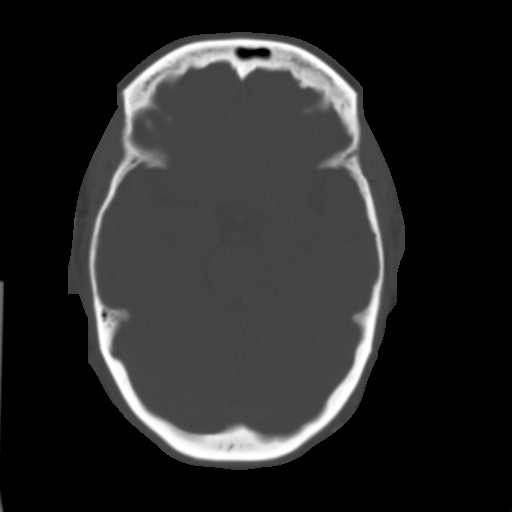
[im 11/30  brain]
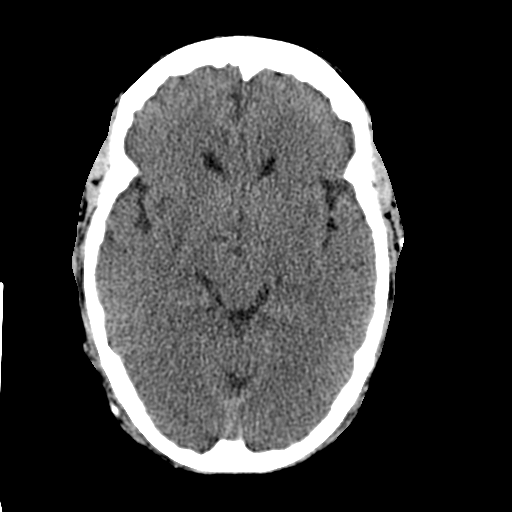
[im 13/30  brain]
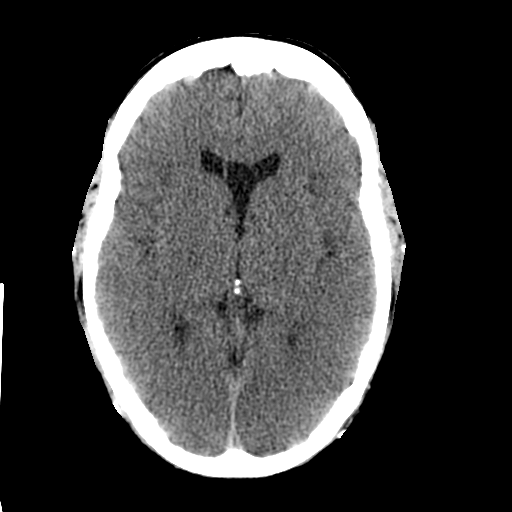
[im 15/30  brain]
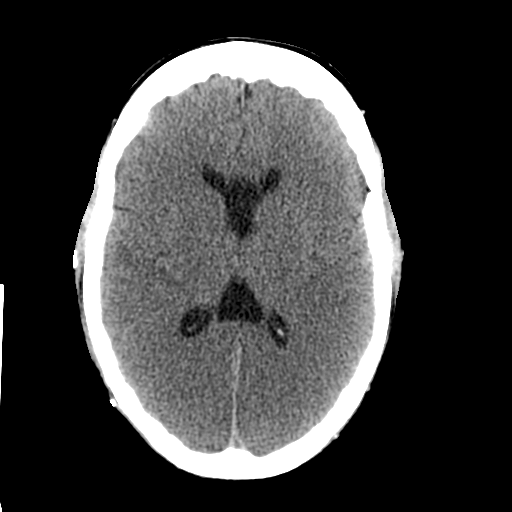
[im 16/30  brain]
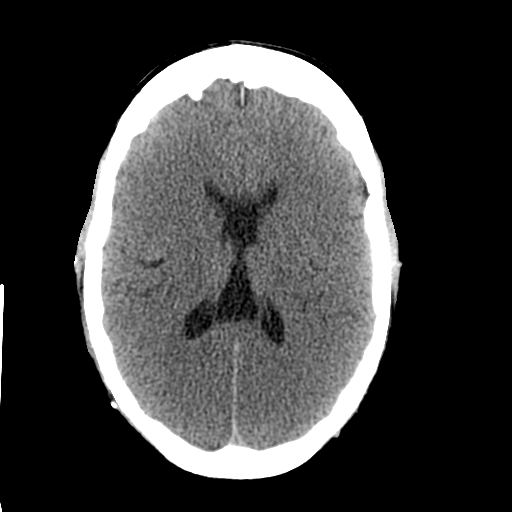
[im 16/30  bone]
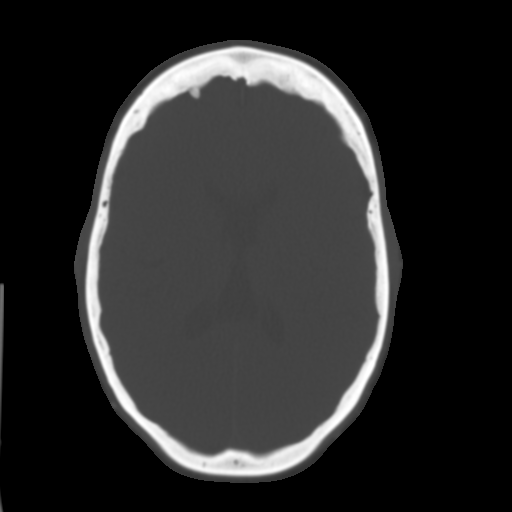
[im 18/30  brain]
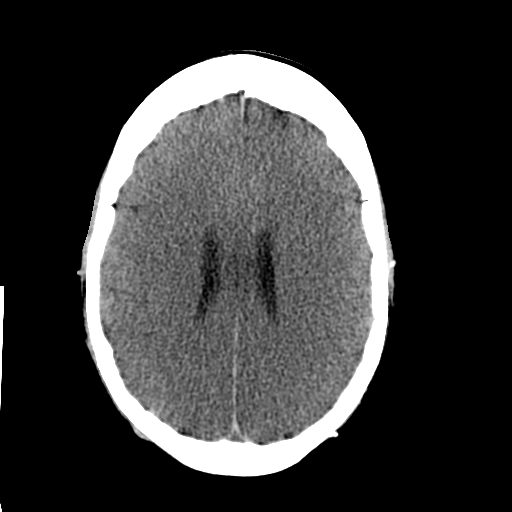
[im 20/30  brain]
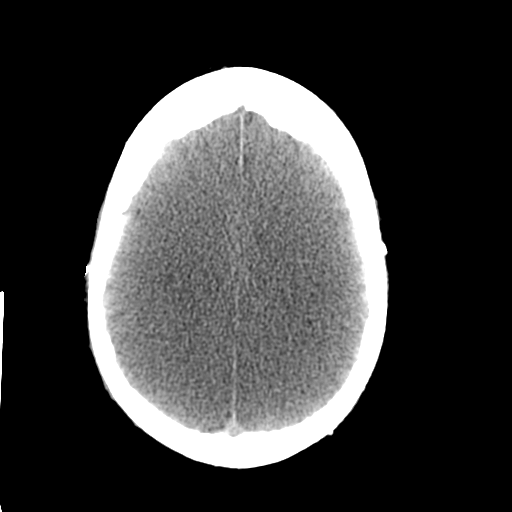
[im 22/30  brain]
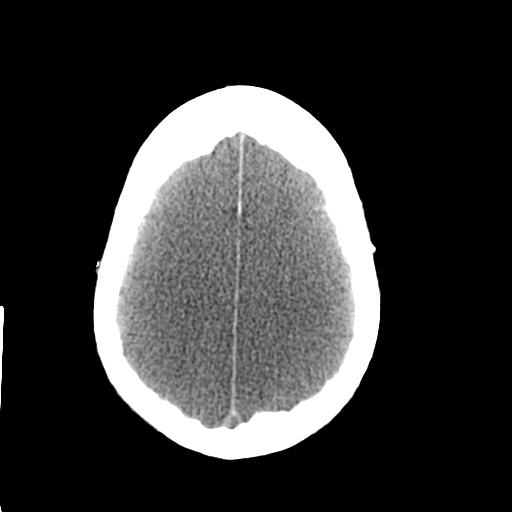
[im 23/30  brain]
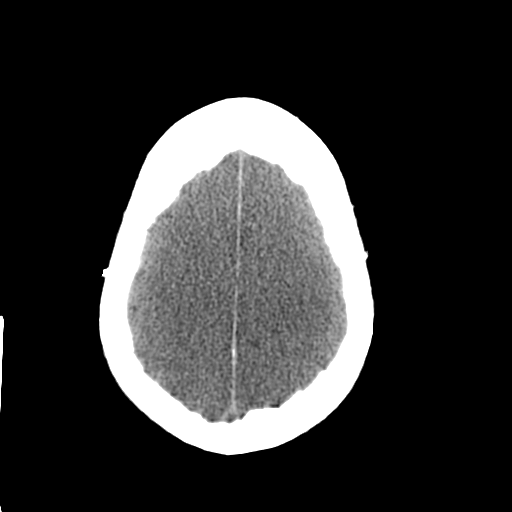
[im 23/30  bone]
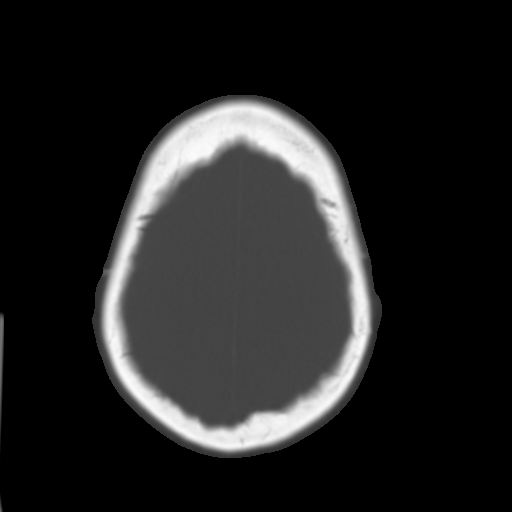
[im 25/30  brain]
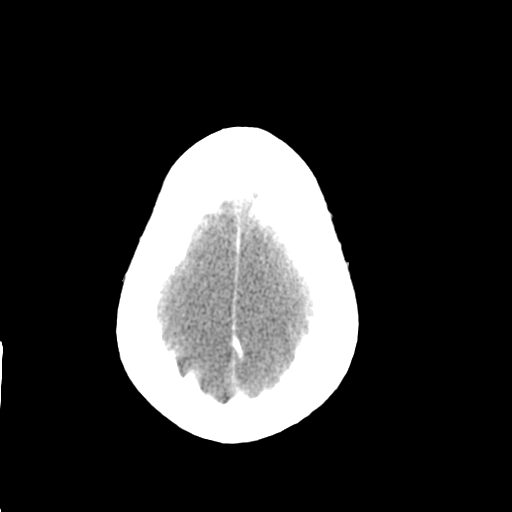
[im 27/30  brain]
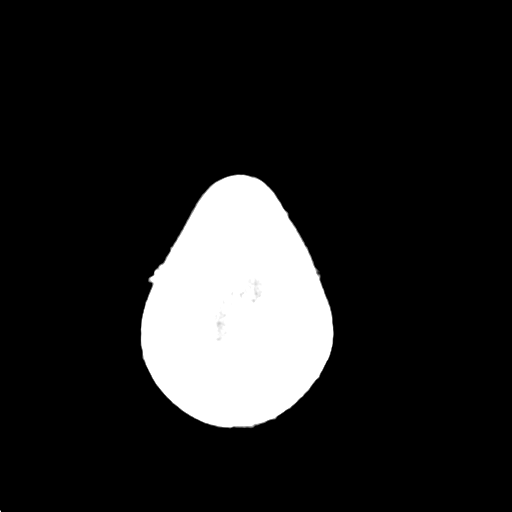
[im 29/30  brain]
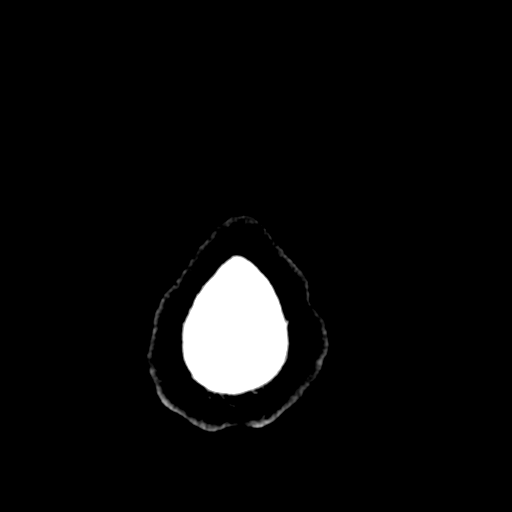

[16 of 30 positions shown; findings below may reference images not displayed]

FINDINGS: Loss of gray-white differentiation with diffuse cerebral
edema.  Basal ganglia is poorly defined.  The findings indicate
severe hypoxic insult to the brain.

Negative for hemorrhage or mass lesion.  Ventricles are not
enlarged.

Septum cavum pellucidum and vergae are noted which are normal
variants.

Extensive sinusitis with air-fluid levels in both maxillary
sinuses.
IMPRESSION: The brain shows diffuse cerebral edema.  Given the clinical
setting, this is compatible with severe hypoxic ischemic insult.

Severe sinusitis.

Critical Value/emergent results were called by telephone at the
time of interpretation on 12/08/2010  at 6979 hours  to  Dr. Jhonson,
who verbally acknowledged these results.

## 2011-10-14 IMAGING — CR DG CHEST 1V PORT
1 series · 1 of 1 positions shown · non-contrast
Comparison: 12/08/2010

CLINICAL DATA: Ventilator patient.

PORTABLE CHEST - 1 VIEW

[view not recorded]
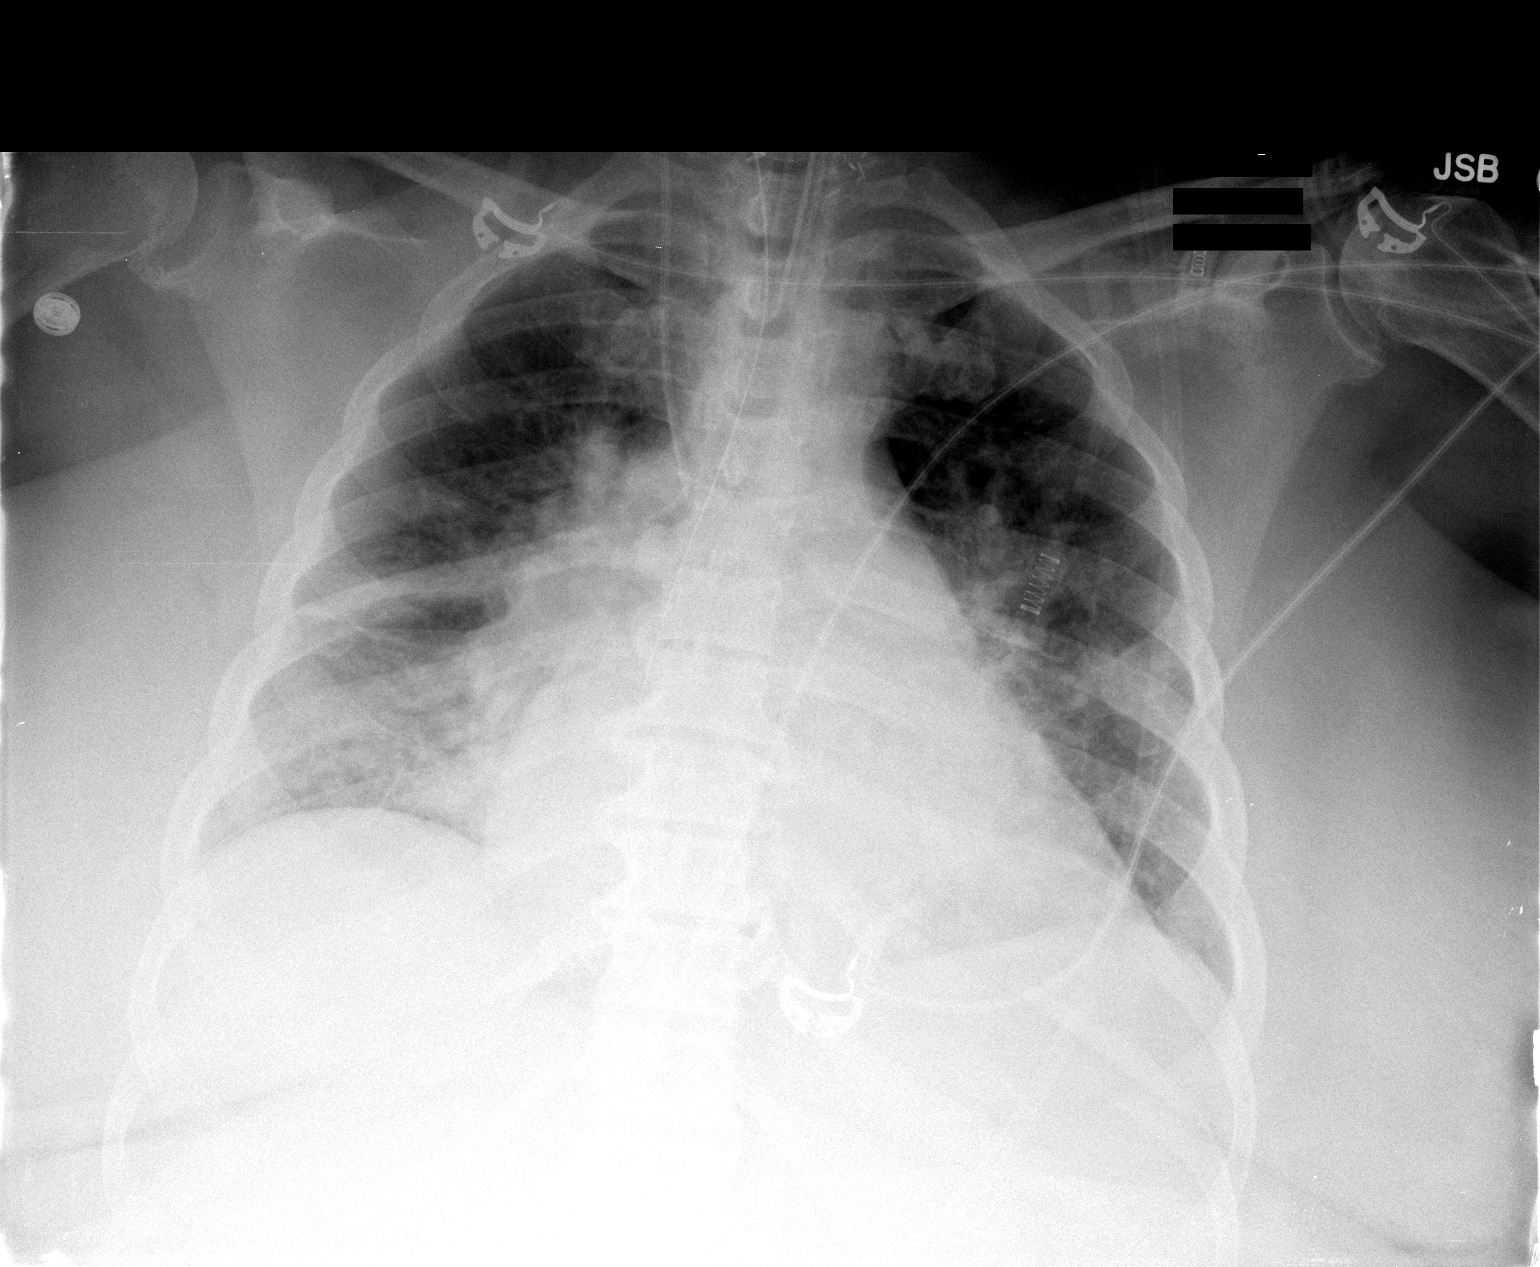

[1 of 1 positions shown; findings below may reference images not displayed]

FINDINGS: Endotracheal tube is 6.4 cm above the carina.
Nasogastric tube extends into the abdomen.  Jugular central line is
in the upper SVC region.  The heart remains enlarged.  Prominent
central vascular structures are most compatible with edema.  No
evidence for a pneumothorax.
IMPRESSION: Support apparatuses as described.

Cardiomegaly with enlarged central vascular structures.  Findings
are suggestive for edema.  No significant change from the prior
examination.
